# Patient Record
Sex: Female | Born: 1935 | Race: White | Hispanic: No | Smoking: Never smoker
Health system: Southern US, Community
[De-identification: ages and names within clinical notes are randomized; demographics above are authoritative.]

## PROBLEM LIST (undated history)

## (undated) DIAGNOSIS — E785 Hyperlipidemia, unspecified: Secondary | ICD-10-CM

## (undated) DIAGNOSIS — Z780 Asymptomatic menopausal state: Secondary | ICD-10-CM

## (undated) DIAGNOSIS — M1990 Unspecified osteoarthritis, unspecified site: Secondary | ICD-10-CM

## (undated) DIAGNOSIS — I10 Essential (primary) hypertension: Secondary | ICD-10-CM

## (undated) DIAGNOSIS — M8580 Other specified disorders of bone density and structure, unspecified site: Secondary | ICD-10-CM

## (undated) DIAGNOSIS — D649 Anemia, unspecified: Secondary | ICD-10-CM

## (undated) DIAGNOSIS — G5600 Carpal tunnel syndrome, unspecified upper limb: Secondary | ICD-10-CM

## (undated) DIAGNOSIS — H409 Unspecified glaucoma: Secondary | ICD-10-CM

## (undated) DIAGNOSIS — F0390 Unspecified dementia without behavioral disturbance: Secondary | ICD-10-CM

## (undated) DIAGNOSIS — G629 Polyneuropathy, unspecified: Secondary | ICD-10-CM

## (undated) DIAGNOSIS — Z1371 Encounter for nonprocreative screening for genetic disease carrier status: Secondary | ICD-10-CM

## (undated) HISTORY — DX: Asymptomatic menopausal state: Z78.0

## (undated) HISTORY — DX: Hyperlipidemia, unspecified: E78.5

## (undated) HISTORY — DX: Unspecified glaucoma: H40.9

## (undated) HISTORY — DX: Anemia, unspecified: D64.9

## (undated) HISTORY — DX: Polyneuropathy, unspecified: G62.9

## (undated) HISTORY — DX: Unspecified osteoarthritis, unspecified site: M19.90

## (undated) HISTORY — PX: CATARACT EXTRACTION: SUR2

## (undated) HISTORY — PX: MASTECTOMY: SHX3

## (undated) HISTORY — DX: Encounter for nonprocreative screening for genetic disease carrier status: Z13.71

## (undated) HISTORY — DX: Carpal tunnel syndrome, unspecified upper limb: G56.00

## (undated) HISTORY — DX: Other specified disorders of bone density and structure, unspecified site: M85.80

## (undated) HISTORY — PX: TRIGGER FINGER RELEASE: SHX641

## (undated) HISTORY — DX: Essential (primary) hypertension: I10

## (undated) HISTORY — DX: Unspecified dementia, unspecified severity, without behavioral disturbance, psychotic disturbance, mood disturbance, and anxiety: F03.90

---

## 2001-05-14 HISTORY — PX: CARDIAC CATHETERIZATION: SHX172

## 2008-05-14 DIAGNOSIS — Z923 Personal history of irradiation: Secondary | ICD-10-CM

## 2008-05-14 HISTORY — DX: Personal history of irradiation: Z92.3

## 2008-10-13 DIAGNOSIS — C50919 Malignant neoplasm of unspecified site of unspecified female breast: Secondary | ICD-10-CM

## 2008-10-13 HISTORY — DX: Malignant neoplasm of unspecified site of unspecified female breast: C50.919

## 2015-05-15 HISTORY — PX: BLADDER SURGERY: SHX569

## 2020-06-07 HISTORY — PX: VAGINAL PROLAPSE REPAIR: SHX830

## 2020-06-07 HISTORY — PX: OTHER SURGICAL HISTORY: SHX169

## 2020-09-11 DIAGNOSIS — C50919 Malignant neoplasm of unspecified site of unspecified female breast: Secondary | ICD-10-CM | POA: Insufficient documentation

## 2020-09-16 HISTORY — PX: MASTECTOMY: SHX3

## 2021-04-14 ENCOUNTER — Other Ambulatory Visit: Payer: Self-pay

## 2021-04-14 ENCOUNTER — Inpatient Hospital Stay
Admission: EM | Admit: 2021-04-14 | Discharge: 2021-04-19 | DRG: 536 | Disposition: A | Discharge status: Skilled Nursing Facility | Payer: Medicare Other | Attending: Student in an Organized Health Care Education/Training Program | Admitting: Student in an Organized Health Care Education/Training Program

## 2021-04-14 ENCOUNTER — Emergency Department: Payer: Medicare Other

## 2021-04-14 DIAGNOSIS — Y92009 Unspecified place in unspecified non-institutional (private) residence as the place of occurrence of the external cause: Secondary | ICD-10-CM

## 2021-04-14 DIAGNOSIS — Z20822 Contact with and (suspected) exposure to covid-19: Secondary | ICD-10-CM | POA: Diagnosis present

## 2021-04-14 DIAGNOSIS — S72009A Fracture of unspecified part of neck of unspecified femur, initial encounter for closed fracture: Secondary | ICD-10-CM | POA: Diagnosis present

## 2021-04-14 DIAGNOSIS — I440 Atrioventricular block, first degree: Secondary | ICD-10-CM | POA: Diagnosis not present

## 2021-04-14 DIAGNOSIS — E785 Hyperlipidemia, unspecified: Secondary | ICD-10-CM | POA: Diagnosis not present

## 2021-04-14 DIAGNOSIS — S32119A Unspecified Zone I fracture of sacrum, initial encounter for closed fracture: Secondary | ICD-10-CM | POA: Diagnosis present

## 2021-04-14 DIAGNOSIS — S3282XA Multiple fractures of pelvis without disruption of pelvic ring, initial encounter for closed fracture: Principal | ICD-10-CM | POA: Diagnosis present

## 2021-04-14 DIAGNOSIS — I10 Essential (primary) hypertension: Secondary | ICD-10-CM | POA: Diagnosis not present

## 2021-04-14 DIAGNOSIS — R112 Nausea with vomiting, unspecified: Secondary | ICD-10-CM | POA: Diagnosis not present

## 2021-04-14 DIAGNOSIS — E876 Hypokalemia: Secondary | ICD-10-CM | POA: Diagnosis not present

## 2021-04-14 DIAGNOSIS — R001 Bradycardia, unspecified: Secondary | ICD-10-CM | POA: Diagnosis not present

## 2021-04-14 DIAGNOSIS — H409 Unspecified glaucoma: Secondary | ICD-10-CM | POA: Diagnosis not present

## 2021-04-14 DIAGNOSIS — W19XXXA Unspecified fall, initial encounter: Secondary | ICD-10-CM | POA: Diagnosis not present

## 2021-04-14 DIAGNOSIS — I4510 Unspecified right bundle-branch block: Secondary | ICD-10-CM | POA: Diagnosis not present

## 2021-04-14 DIAGNOSIS — F0390 Unspecified dementia without behavioral disturbance: Secondary | ICD-10-CM | POA: Diagnosis not present

## 2021-04-14 DIAGNOSIS — S72001A Fracture of unspecified part of neck of right femur, initial encounter for closed fracture: Secondary | ICD-10-CM

## 2021-04-14 DIAGNOSIS — Z79899 Other long term (current) drug therapy: Secondary | ICD-10-CM

## 2021-04-14 DIAGNOSIS — I119 Hypertensive heart disease without heart failure: Secondary | ICD-10-CM | POA: Diagnosis not present

## 2021-04-14 DIAGNOSIS — E871 Hypo-osmolality and hyponatremia: Secondary | ICD-10-CM | POA: Diagnosis present

## 2021-04-14 DIAGNOSIS — E7849 Other hyperlipidemia: Secondary | ICD-10-CM | POA: Diagnosis not present

## 2021-04-14 LAB — CBC WITH DIFFERENTIAL/PLATELET
Abs Immature Granulocytes: 0.07 10*3/uL (ref 0.00–0.07)
Basophils Absolute: 0.1 10*3/uL (ref 0.0–0.1)
Basophils Relative: 0 %
Eosinophils Absolute: 0 10*3/uL (ref 0.0–0.5)
Eosinophils Relative: 0 %
HCT: 40.5 % (ref 36.0–46.0)
Hemoglobin: 13.7 g/dL (ref 12.0–15.0)
Immature Granulocytes: 1 %
Lymphocytes Relative: 8 %
Lymphs Abs: 1.2 10*3/uL (ref 0.7–4.0)
MCH: 31.2 pg (ref 26.0–34.0)
MCHC: 33.8 g/dL (ref 30.0–36.0)
MCV: 92.3 fL (ref 80.0–100.0)
Monocytes Absolute: 0.8 10*3/uL (ref 0.1–1.0)
Monocytes Relative: 5 %
Neutro Abs: 12.6 10*3/uL — ABNORMAL HIGH (ref 1.7–7.7)
Neutrophils Relative %: 86 %
Platelets: 309 10*3/uL (ref 150–400)
RBC: 4.39 MIL/uL (ref 3.87–5.11)
RDW: 13.1 % (ref 11.5–15.5)
WBC: 14.8 10*3/uL — ABNORMAL HIGH (ref 4.0–10.5)
nRBC: 0 % (ref 0.0–0.2)

## 2021-04-14 LAB — BASIC METABOLIC PANEL
Anion gap: 9 (ref 5–15)
BUN: 21 mg/dL (ref 8–23)
CO2: 29 mmol/L (ref 22–32)
Calcium: 9.4 mg/dL (ref 8.9–10.3)
Chloride: 92 mmol/L — ABNORMAL LOW (ref 98–111)
Creatinine, Ser: 0.7 mg/dL (ref 0.44–1.00)
GFR, Estimated: >60 mL/min (ref >60)
Glucose, Bld: 137 mg/dL — ABNORMAL HIGH (ref 70–99)
Potassium: 3.2 mmol/L — ABNORMAL LOW (ref 3.5–5.1)
Sodium: 130 mmol/L — ABNORMAL LOW (ref 135–145)

## 2021-04-14 LAB — RESP PANEL BY RT-PCR (FLU A&B, COVID) ARPGX2
Influenza A by PCR: NEGATIVE
Influenza B by PCR: NEGATIVE
SARS Coronavirus 2 by RT PCR: NEGATIVE

## 2021-04-14 MED ORDER — MORPHINE SULFATE (PF) 2 MG/ML IV SOLN: 0.5000 mg | INTRAVENOUS | Status: DC | PRN

## 2021-04-14 MED ORDER — SODIUM CHLORIDE 0.9 % IV SOLN
Freq: Once | INTRAVENOUS | Status: AC
  Administered 2021-04-14: 1000 mL via INTRAVENOUS

## 2021-04-14 MED ORDER — LATANOPROST 0.005 % OP SOLN
1.0000 [drp] | Freq: Every day | OPHTHALMIC | Status: DC
  Administered 2021-04-14 – 2021-04-18 (×5): 1 [drp] via OPHTHALMIC
  Filled 2021-04-14: qty 2.5

## 2021-04-14 MED ORDER — METHOCARBAMOL 1000 MG/10ML IJ SOLN
500.0000 mg | Freq: Four times a day (QID) | INTRAVENOUS | Status: DC | PRN
  Filled 2021-04-14: qty 5

## 2021-04-14 MED ORDER — ACETAMINOPHEN 500 MG PO TABS
1000.0000 mg | ORAL_TABLET | Freq: Once | ORAL | Status: AC
  Administered 2021-04-14: 1000 mg via ORAL
  Filled 2021-04-14: qty 2

## 2021-04-14 MED ORDER — ROSUVASTATIN CALCIUM 10 MG PO TABS
20.0000 mg | ORAL_TABLET | Freq: Every day | ORAL | Status: DC
  Administered 2021-04-14 – 2021-04-18 (×5): 20 mg via ORAL
  Filled 2021-04-14 (×4): qty 2
  Filled 2021-04-14: qty 1
  Filled 2021-04-14: qty 2

## 2021-04-14 MED ORDER — ONDANSETRON HCL 4 MG/2ML IJ SOLN
4.0000 mg | Freq: Once | INTRAMUSCULAR | Status: AC
  Administered 2021-04-14: 4 mg via INTRAVENOUS
  Filled 2021-04-14: qty 2

## 2021-04-14 MED ORDER — HYDROCODONE-ACETAMINOPHEN 5-325 MG PO TABS
1.0000 | ORAL_TABLET | Freq: Four times a day (QID) | ORAL | Status: DC | PRN
  Administered 2021-04-14: 1 via ORAL
  Filled 2021-04-14: qty 1

## 2021-04-14 MED ORDER — GLUCOSAMINE CHONDR 1500 COMPLX PO CAPS: 2.0000 | ORAL_CAPSULE | Freq: Every day | ORAL | Status: DC

## 2021-04-14 MED ORDER — OXYCODONE HCL 5 MG PO TABS
2.5000 mg | ORAL_TABLET | Freq: Once | ORAL | Status: AC
  Administered 2021-04-14: 2.5 mg via ORAL
  Filled 2021-04-14: qty 1

## 2021-04-14 MED ORDER — POTASSIUM CHLORIDE CRYS ER 20 MEQ PO TBCR
20.0000 meq | EXTENDED_RELEASE_TABLET | Freq: Once | ORAL | Status: AC
  Administered 2021-04-14: 20 meq via ORAL
  Filled 2021-04-14: qty 1

## 2021-04-14 MED ORDER — ASCORBIC ACID 500 MG PO TABS
1000.0000 mg | ORAL_TABLET | Freq: Every day | ORAL | Status: DC
  Administered 2021-04-15 – 2021-04-19 (×5): 1000 mg via ORAL
  Filled 2021-04-14 (×5): qty 2

## 2021-04-14 MED ORDER — MAGNESIUM OXIDE 400 MG PO TABS
400.0000 mg | ORAL_TABLET | Freq: Every day | ORAL | Status: DC
  Administered 2021-04-15 – 2021-04-19 (×5): 400 mg via ORAL
  Filled 2021-04-14 (×10): qty 1

## 2021-04-14 MED ORDER — POLYETHYLENE GLYCOL 3350 17 G PO PACK
17.0000 g | PACK | Freq: Every day | ORAL | Status: DC | PRN
  Administered 2021-04-18: 17 g via ORAL
  Filled 2021-04-14: qty 1

## 2021-04-14 MED ORDER — METHOCARBAMOL 500 MG PO TABS
500.0000 mg | ORAL_TABLET | Freq: Four times a day (QID) | ORAL | Status: DC | PRN
  Filled 2021-04-14 (×2): qty 1

## 2021-04-14 MED ORDER — TRAZODONE HCL 50 MG PO TABS
25.0000 mg | ORAL_TABLET | Freq: Every evening | ORAL | Status: DC | PRN
  Administered 2021-04-16: 23:00:00 25 mg via ORAL
  Filled 2021-04-14: qty 1

## 2021-04-14 MED ORDER — ENOXAPARIN SODIUM 40 MG/0.4ML IJ SOSY
40.0000 mg | PREFILLED_SYRINGE | INTRAMUSCULAR | Status: DC
  Administered 2021-04-14 – 2021-04-18 (×5): 40 mg via SUBCUTANEOUS
  Filled 2021-04-14 (×5): qty 0.4

## 2021-04-14 MED ORDER — DORZOLAMIDE HCL 2 % OP SOLN
1.0000 [drp] | Freq: Two times a day (BID) | OPHTHALMIC | Status: DC
  Administered 2021-04-15 – 2021-04-19 (×8): 1 [drp] via OPHTHALMIC
  Filled 2021-04-14: qty 10

## 2021-04-14 MED ORDER — CHLORTHALIDONE 25 MG PO TABS
25.0000 mg | ORAL_TABLET | Freq: Every day | ORAL | Status: DC
  Administered 2021-04-15 – 2021-04-17 (×3): 25 mg via ORAL
  Filled 2021-04-14 (×3): qty 1

## 2021-04-14 MED ORDER — VITAMIN D 25 MCG (1000 UNIT) PO TABS
4000.0000 [IU] | ORAL_TABLET | Freq: Every day | ORAL | Status: DC
  Administered 2021-04-15 – 2021-04-19 (×5): 4000 [IU] via ORAL
  Filled 2021-04-14 (×5): qty 4

## 2021-04-14 NOTE — ED Notes (Signed)
Pt NAD in bed on mega mover. A/ox3, baseline per family. Per granddaughter, pt lives with them and was found on the floor of her room. Unwitnessed fall and unknown down time. Pt c/o right hip pain. +pmsc. Denies head/neck pain or blood thinners.

## 2021-04-14 NOTE — H&P (Signed)
[facility] History and Physical  Suzanne Nunez WJX:[id number] DOB: 30-Jun-1931 DOA: 04/14/2021  Referring physician: Dr. Ellender Hose PCP: No primary care provider on file.   Chief Complaint: fall  HPI: Suzanne Nunez is a 87 y.o. female with history of dementia, hypertension, who presents after a fall.  Patient lives with her granddaughter who is at bedside along with patient's daughter.  Patient appeared somewhat confused whenever I tried to obtain history from her, so majority of history obtained from combination of patient and her granddaughter.  They report that she has fallen once or twice before according to her uncle who used to live with the patient.  They have been encouraging her to use walls and doorways to steady herself as well as a cane and a walker, but she does not like it.  Earlier today she had an unwitnessed fall onto her right side, her granddaughter found her and called EMS.  Patient currently denies any numbness or tingling in her lower extremities, does have some pain in her hip.  In the ED vital signs were unremarkable.  Basic labs showed very mild leukocytosis, mild hyponatremia at 130, potassium 3.2, glucose 137, remainder of labs unremarkable.  CT head showed no acute intracranial abnormality.  Plain films of the hip showed acute fracture of the right pubic bone.  Noncon CT pelvis was subsequently obtained which showed comminuted fracture of the right superior and inferior pubic rami without involvement of the acetabulum, as well as a minimally displaced fracture of the right sacral ala.  EKG showed no acute ischemic changes.  ED provider spoke with on-call orthopedist who reviewed case and and stated that she was nonoperative and could be weightbearing as tolerated.  She is admitted for further management.  Review of Systems:  Pertinent positives and negative per HPI, all others reviewed and negative  History reviewed. No pertinent past medical history. History  reviewed. No pertinent surgical history. Social History:  has no history on file for tobacco use, alcohol use, and drug use.  No Known Allergies  History reviewed. No pertinent family history.   Prior to Admission medications   Not on File   Physical Exam: Vitals:   04/14/21 1011 04/14/21 1015 04/14/21 1615  BP: 137/80  (!) 144/85  Pulse: 73  69  Resp: 16  15  Temp: 98.2 F (36.8 C)    SpO2: 94%  97%  Weight:  59 kg   Height:  5\' 3"  (1.6 m)     Wt Readings from Last 3 Encounters:  04/14/21 59 kg     General:  Appears calm and comfortable Eyes: PERRL, normal lids, irises & conjunctiva ENT: grossly normal hearing, lips & tongue Neck: no masses Cardiovascular: RRR, no m/r/g. No LE edema.  2+ DP pulses bilaterally Telemetry: SR, no arrhythmias  Respiratory: CTA bilaterally, no w/r/r. Normal respiratory effort. Abdomen: soft, ntnd Skin: no rash or induration seen on limited exam Musculoskeletal: grossly normal tone BUE/BLE Psychiatric: grossly normal mood and affect, speech fluent and appropriate Neurologic: grossly non-focal.  Sensation intact to light touch in lower extremities bilaterally, spontaneous movements of bilateral lower extremities observed.          Labs on Admission:  Basic Metabolic Panel: Recent Labs  Lab 04/14/21 1440  NA 130*  K 3.2*  CL 92*  CO2 29  GLUCOSE 137*  BUN 21  CREATININE 0.70  CALCIUM 9.4   Liver Function Tests: No results for input(s): AST, ALT, ALKPHOS, BILITOT, PROT, ALBUMIN in the last 168  hours. No results for input(s): LIPASE, AMYLASE in the last 168 hours. No results for input(s): AMMONIA in the last 168 hours. CBC: Recent Labs  Lab 04/14/21 1440  WBC 14.8*  NEUTROABS 12.6*  HGB 13.7  HCT 40.5  MCV 92.3  PLT 309   Cardiac Enzymes: No results for input(s): CKTOTAL, CKMB, CKMBINDEX, TROPONINI in the last 168 hours.  BNP (last 3 results) No results for input(s): BNP in the last 8760 hours.  ProBNP (last 3  results) No results for input(s): PROBNP in the last 8760 hours.  CBG: No results for input(s): GLUCAP in the last 168 hours.  Radiological Exams on Admission: CT Head Wo Contrast  Result Date: 04/14/2021 CLINICAL DATA:  Trauma EXAM: CT HEAD WITHOUT CONTRAST TECHNIQUE: Contiguous axial images were obtained from the base of the skull through the vertex without intravenous contrast. COMPARISON:  None. FINDINGS: Brain: Chronic white matter ischemic change. No evidence of acute infarction, hemorrhage, hydrocephalus, extra-axial collection or mass lesion/mass effect. Vascular: No hyperdense vessel or unexpected calcification. Skull: Normal. Negative for fracture or focal lesion. Sinuses/Orbits: Partial opacification of the right sphenoid sinus. No acute finding. Other: None. IMPRESSION: No acute intracranial abnormality. Electronically Signed   By: Yetta Glassman M.D.   On: 04/14/2021 11:39   CT PELVIS WO CONTRAST  Result Date: 04/14/2021 CLINICAL DATA:  Fall, evaluate right pubic fractures EXAM: CT PELVIS WITHOUT CONTRAST TECHNIQUE: Multidetector CT imaging of the pelvis was performed following the standard protocol without intravenous contrast. COMPARISON:  Same day radiographs of the right hip FINDINGS: Urinary Tract:  No abnormality visualized. Bowel:  Sigmoid diverticulosis. Vascular/Lymphatic: No pathologically enlarged lymph nodes. Aortic atherosclerosis. Reproductive:  No mass or other significant abnormality Other:  None. Musculoskeletal: No suspicious bone lesions identified. Osteopenia. Comminuted fractures of the right superior and inferior pubic rami about the pubic symphysis (series 2, image 81) with additional minimally displaced fracture of the mid right inferior pubic ramus (series 2, image 98). Additional minimally displaced fractures of the right sacral ala (series 2, image 37). IMPRESSION: 1. Comminuted fractures of the right superior and inferior pubic rami about the pubic symphysis  with additional minimally displaced fracture of the mid right inferior pubic ramus, as seen on prior radiographs. No involvement of the acetabulum. 2. Additional minimally displaced fractures of the right sacral ala. Aortic Atherosclerosis (ICD10-I70.0). Electronically Signed   By: Delanna Ahmadi M.D.   On: 04/14/2021 13:20   DG Hip Unilat  With Pelvis 2-3 Views Right  Result Date: 04/14/2021 CLINICAL DATA:  Fall, hip pain EXAM: DG HIP (WITH OR WITHOUT PELVIS) 2-3V RIGHT COMPARISON:  None. FINDINGS: Acute comminuted fractures of the right parasymphyseal pubic bone and superior and inferior pubic rami, with minimal displacement of fracture fragments. No acute fracture of the proximal right femur. No hip dislocation. IMPRESSION: Acute fractures of the right pubic bone. Electronically Signed   By: Ofilia Neas M.D.   On: 04/14/2021 12:01    EKG: Independently reviewed.  Sinus, signs of LVH and lateral precordial leads, no ischemic changes, frequent PVCs.  Assessment/Plan Principal Problem:   Hip fracture (HCC)   Suzanne Nunez is a 87 y.o. female with history of dementia, hypertension, who presents after a fall and is found to have a hip fracture.  #Hip fracture Per discussion with orthopedics patient is not an operative candidate, needs rehab and can be weightbearing as tolerated. - PT and OT consults - Weightbearing as tolerated - Routine hip fracture care  #Chronic medical problems Hypertension-continue chlorthalidone  Glaucoma-continue dorzolamide, latanoprost  Hyperlipidemia-continue rosuvastatin  Code Status: Full code, confirmed DVT Prophylaxis: Lovenox Family Communication: Daughter and granddaughter updated at bedside Disposition Plan: Inpatient, MedSurg  Time spent: 80 min  Clarnce Flock MD/[facility]  Note:  This document was prepared using Systems analyst and may include unintentional dictation errors.

## 2021-04-14 NOTE — ED Provider Notes (Signed)
Patient seen by Dr. Tamala Julian. Admit to medicine for pelvic fx s/p mechanical fall. Family updated and in agreement. Will trial low dose oxycodone and tylenol for pain.    Duffy Bruce, MD 04/14/21 Dorthula Perfect

## 2021-04-14 NOTE — ED Provider Notes (Signed)
[facility] Emergency Department Provider Note ____________________________________________   Event Date/Time   First MD Initiated Contact with Patient 04/14/21 1016     (approximate)  I have reviewed the triage vital signs and the nursing notes.  HISTORY  Chief Complaint Fall and Hip Pain   HPI Suzanne Nunez is a 87 y.o. femalewho presents to the ED for evaluation of fall and right-sided hip pain.  Chart review indicates no relevant history within our system.  Daughter brings patient to the ED for the ration of an unwitnessed fall. Patient lives at home with family, typically ambulatory with a cane, sometimes using the walker.  Daughter found her this morning facedown on the rug in her bedroom, awake without evidence of seizure activity.  Patient is pleasantly disoriented and cannot give significant history about what happened.  Daughter reports that she was fine yesterday and seems to be at her behavioral baseline now.  No recent illnesses or other concerns beyond this fall today.   History limited by disorientation  History reviewed. No pertinent past medical history.  There are no problems to display for this patient.    Prior to Admission medications   Not on File    Allergies Patient has no known allergies.  No family history on file.  Social History    Review of Systems  Unable to accurately assess due to patient's disorientation. ____________________________________________   PHYSICAL EXAM:  VITAL SIGNS: Vitals:   04/14/21 1011  BP: 137/80  Pulse: 73  Resp: 16  Temp: 98.2 F (36.8 C)  SpO2: 94%    Constitutional: Alert and oriented. Well appearing and in no acute distress. Eyes: Conjunctivae are normal. PERRL. EOMI. Head: Atraumatic. Nose: No congestion/rhinnorhea. Mouth/Throat: Mucous membranes are moist.  Oropharynx non-erythematous. Neck: No stridor. No cervical spine tenderness to palpation. Cardiovascular:  Normal rate, regular rhythm. Grossly normal heart sounds.  Good peripheral circulation. Respiratory: Normal respiratory effort.  No retractions. Lungs CTAB. Gastrointestinal: Soft , nondistended, nontender to palpation. No CVA tenderness. Musculoskeletal: Minimal tenderness with logroll of the right lower extremity.  No shortening or rotation.  Is able to flex her right knee and hip, although appears to cause some discomfort. Bilateral legs are distally neurovascularly intact. No othe signs of trauma or deformity beyond the mild tenderness to her right hip with ranging. Neurologic:  Normal speech and language. No gross focal neurologic deficits are appreciated. No gait instability noted. Skin:  Skin is warm, dry and intact. No rash noted. Psychiatric: Mood and affect are normal. Speech and behavior are normal. ____________________________________________   LABS (all labs ordered are listed, but only abnormal results are displayed)  Labs Reviewed  RESP PANEL BY RT-PCR (FLU A&B, COVID) [id number]  URINALYSIS, COMPLETE (UACMP) WITH MICROSCOPIC  CBC WITH DIFFERENTIAL/PLATELET  BASIC METABOLIC PANEL   ____________________________________________  12 Lead EKG   ____________________________________________  RADIOLOGY  ED MD interpretation: CT head reviewed by me without evidence of acute cranial pathology. Plain film of the right hip and pelvis reviewed by me with evidence of superior and inferior pubic rami fractures.  Official radiology report(s): CT Head Wo Contrast  Result Date: 04/14/2021 CLINICAL DATA:  Trauma EXAM: CT HEAD WITHOUT CONTRAST TECHNIQUE: Contiguous axial images were obtained from the base of the skull through the vertex without intravenous contrast. COMPARISON:  None. FINDINGS: Brain: Chronic white matter ischemic change. No evidence of acute infarction, hemorrhage, hydrocephalus, extra-axial collection or mass lesion/mass effect. Vascular: No hyperdense vessel or  unexpected calcification. Skull: Normal. Negative  for fracture or focal lesion. Sinuses/Orbits: Partial opacification of the right sphenoid sinus. No acute finding. Other: None. IMPRESSION: No acute intracranial abnormality. Electronically Signed   By: Yetta Glassman M.D.   On: 04/14/2021 11:39   CT PELVIS WO CONTRAST  Result Date: 04/14/2021 CLINICAL DATA:  Fall, evaluate right pubic fractures EXAM: CT PELVIS WITHOUT CONTRAST TECHNIQUE: Multidetector CT imaging of the pelvis was performed following the standard protocol without intravenous contrast. COMPARISON:  Same day radiographs of the right hip FINDINGS: Urinary Tract:  No abnormality visualized. Bowel:  Sigmoid diverticulosis. Vascular/Lymphatic: No pathologically enlarged lymph nodes. Aortic atherosclerosis. Reproductive:  No mass or other significant abnormality Other:  None. Musculoskeletal: No suspicious bone lesions identified. Osteopenia. Comminuted fractures of the right superior and inferior pubic rami about the pubic symphysis (series 2, image 81) with additional minimally displaced fracture of the mid right inferior pubic ramus (series 2, image 98). Additional minimally displaced fractures of the right sacral ala (series 2, image 37). IMPRESSION: 1. Comminuted fractures of the right superior and inferior pubic rami about the pubic symphysis with additional minimally displaced fracture of the mid right inferior pubic ramus, as seen on prior radiographs. No involvement of the acetabulum. 2. Additional minimally displaced fractures of the right sacral ala. Aortic Atherosclerosis (ICD10-I70.0). Electronically Signed   By: Delanna Ahmadi M.D.   On: 04/14/2021 13:20   DG Hip Unilat  With Pelvis 2-3 Views Right  Result Date: 04/14/2021 CLINICAL DATA:  Fall, hip pain EXAM: DG HIP (WITH OR WITHOUT PELVIS) 2-3V RIGHT COMPARISON:  None. FINDINGS: Acute comminuted fractures of the right parasymphyseal pubic bone and superior and inferior pubic rami,  with minimal displacement of fracture fragments. No acute fracture of the proximal right femur. No hip dislocation. IMPRESSION: Acute fractures of the right pubic bone. Electronically Signed   By: Ofilia Neas M.D.   On: 04/14/2021 12:01    ____________________________________________   PROCEDURES and INTERVENTIONS  Procedure(s) performed (including Critical Care):  Procedures  Medications  acetaminophen (TYLENOL) tablet 1,000 mg (has no administration in time range)    ____________________________________________   MDM / ED COURSE   Pleasantly demented 87 year old female presents to the ED after unwitnessed fall, with evidence of pelvic bone fractures, requiring medical observation admission to facilitate rehab placement.  Nonoperative fractures noted on x-ray and CT imaging.  No neurologic or vascular deficits.  Attempted to get the patient up and ambulate to hopefully discharge home to the custody of family, who is eager to keep her around and care for her, but due to inability to even stand and ambulate, and uncomfortable with outpatient disposition at this time as it does not appear to be safe for the patient.  Patient still unfortunately in our triage area, we will have to find her a room in the ED prior to planned admission  Clinical Course as of 04/14/21 1456  Fri Apr 14, 2021  1159 Updated daughter [DS]  1214 Discussed CT of the pelvis with daughter.  She is agreeable. [DS]  61 Discussed CT with daughter. Discussed ambulation trial and need for UA. I gave pt some water [DS]  1410 I briefly discussed on the phone with Dr. Mack Guise about CT results.  He reports nonoperative.  Pain control.  Possible SNF placement for PT. [DS]  7482 Patient unable to effectively ambulate.  Even with two-person assistance, she cannot stand and take any steps with a walker.  I discussed this with the daughter and my concern for unsafe discharge plan,  despite family's eagerness to keep her  home.  We discussed medical observation admission to facilitate rehab placement temporarily and they are agreeable. [DS]    Clinical Course User Index [DS] Vladimir Crofts, MD    ____________________________________________   FINAL CLINICAL IMPRESSION(S) / ED DIAGNOSES  Final diagnoses:  Fall, initial encounter  Multiple closed fractures of pelvis without disruption of pelvic ring, initial encounter [facility])     ED Discharge Orders     None        Michon Kaczmarek   Note:  This document was prepared using Dragon voice recognition software and may include unintentional dictation errors.    Vladimir Crofts, MD 04/14/21 1500

## 2021-04-14 NOTE — ED Provider Notes (Signed)
Emergency Medicine Provider Triage Evaluation Note  Suzanne Nunez , a 87 y.o. female  was evaluated in triage.  Pt complains of fall.  Pleasantly demented patient lives at home with her daughter.  Daughter brings patient to the ED for evaluation of an unwitnessed fall.  She was found to have in her bedroom this morning, face down.  She is at her behavioral baseline now.  Disoriented.  Concerns for her right hip..  Review of Systems  Positive: Fall, right hip pain, dementia baseline Negative: No recent illnesses, no known syncopal episodes or seizure activity.  Physical Exam  BP 137/80 (BP Location: Left Arm)   Pulse 73   Temp 98.2 F (36.8 C)   Resp 16   Ht 5\' 3"  (1.6 m)   Wt 59 kg   SpO2 94%   BMI 23.03 kg/m  Gen:   Awake, no distress   Resp:  Normal effort  MSK:   Palpation of all 4 extremities without deformity.  Logrolling of the right lower extremity with no/minimal pain.  She can actually flex her hip and range her knee partially, but has pain with more extreme hip flexion. Other:    Medical Decision Making  Medically screening exam initiated at 10:17 AM.  Appropriate orders placed.  Azul Zuver was informed that the remainder of the evaluation will be completed by another provider, this initial triage assessment does not replace that evaluation, and the importance of remaining in the ED until their evaluation is complete.  Imaging and UA ordered   Vladimir Crofts, MD 04/14/21 1018

## 2021-04-14 NOTE — ED Triage Notes (Signed)
Pt comes into the Ed via EMS from home, called out for a fall, c/o right hip pain Hx of dementia, pt is not aware of falling, per family this is her baseline Daughter is with the pt in the [place] 173/78 70's HR\ 97%RA [id number]

## 2021-04-15 ENCOUNTER — Encounter: Payer: Self-pay | Admitting: Family Medicine

## 2021-04-15 DIAGNOSIS — E7849 Other hyperlipidemia: Secondary | ICD-10-CM

## 2021-04-15 DIAGNOSIS — I10 Essential (primary) hypertension: Secondary | ICD-10-CM | POA: Diagnosis not present

## 2021-04-15 DIAGNOSIS — S72001A Fracture of unspecified part of neck of right femur, initial encounter for closed fracture: Secondary | ICD-10-CM | POA: Diagnosis not present

## 2021-04-15 DIAGNOSIS — S3282XA Multiple fractures of pelvis without disruption of pelvic ring, initial encounter for closed fracture: Secondary | ICD-10-CM | POA: Diagnosis not present

## 2021-04-15 DIAGNOSIS — S72009A Fracture of unspecified part of neck of unspecified femur, initial encounter for closed fracture: Secondary | ICD-10-CM | POA: Diagnosis not present

## 2021-04-15 LAB — CBC
HCT: 37.2 % (ref 36.0–46.0)
Hemoglobin: 12.5 g/dL (ref 12.0–15.0)
MCH: 31.3 pg (ref 26.0–34.0)
MCHC: 33.6 g/dL (ref 30.0–36.0)
MCV: 93.2 fL (ref 80.0–100.0)
Platelets: 278 10*3/uL (ref 150–400)
RBC: 3.99 MIL/uL (ref 3.87–5.11)
RDW: 13.2 % (ref 11.5–15.5)
WBC: 8.7 10*3/uL (ref 4.0–10.5)
nRBC: 0 % (ref 0.0–0.2)

## 2021-04-15 LAB — BASIC METABOLIC PANEL
Anion gap: 7 (ref 5–15)
BUN: 26 mg/dL — ABNORMAL HIGH (ref 8–23)
CO2: 31 mmol/L (ref 22–32)
Calcium: 8.8 mg/dL — ABNORMAL LOW (ref 8.9–10.3)
Chloride: 90 mmol/L — ABNORMAL LOW (ref 98–111)
Creatinine, Ser: 1 mg/dL (ref 0.44–1.00)
GFR, Estimated: 55 mL/min — ABNORMAL LOW (ref >60)
Glucose, Bld: 117 mg/dL — ABNORMAL HIGH (ref 70–99)
Potassium: 3.6 mmol/L (ref 3.5–5.1)
Sodium: 128 mmol/L — ABNORMAL LOW (ref 135–145)

## 2021-04-15 LAB — URINALYSIS, COMPLETE (UACMP) WITH MICROSCOPIC
Bilirubin Urine: NEGATIVE
Glucose, UA: NEGATIVE mg/dL
Ketones, ur: 5 mg/dL — AB
Leukocytes,Ua: NEGATIVE
Nitrite: NEGATIVE
Protein, ur: 100 mg/dL — AB
Specific Gravity, Urine: 1.02 (ref 1.005–1.030)
pH: 5 (ref 5.0–8.0)

## 2021-04-15 MED ORDER — TIMOLOL MALEATE 0.5 % OP SOLN
1.0000 [drp] | Freq: Two times a day (BID) | OPHTHALMIC | Status: DC
  Administered 2021-04-15 – 2021-04-17 (×4): 1 [drp] via OPHTHALMIC
  Filled 2021-04-15: qty 5

## 2021-04-15 MED ORDER — ACETAMINOPHEN 500 MG PO TABS
1000.0000 mg | ORAL_TABLET | Freq: Four times a day (QID) | ORAL | Status: DC | PRN
  Administered 2021-04-15 – 2021-04-18 (×8): 1000 mg via ORAL
  Filled 2021-04-15 (×8): qty 2

## 2021-04-15 MED ORDER — OXYCODONE HCL 5 MG PO TABS
2.5000 mg | ORAL_TABLET | ORAL | Status: DC | PRN
  Administered 2021-04-16 – 2021-04-18 (×3): 2.5 mg via ORAL
  Filled 2021-04-15 (×3): qty 1

## 2021-04-15 MED ORDER — LETROZOLE 2.5 MG PO TABS
2.5000 mg | ORAL_TABLET | Freq: Every day | ORAL | Status: DC
  Administered 2021-04-15 – 2021-04-19 (×5): 2.5 mg via ORAL
  Filled 2021-04-15 (×5): qty 1

## 2021-04-15 NOTE — Evaluation (Signed)
Occupational Therapy Evaluation Patient Details Name: Suzanne Nunez MRN: [id number] DOB: November 24, 1931 Today's Date: 04/15/2021   History of Present Illness Pt admitted to [facility] on 04/14/21 for mechanical fall that resulted in R superior and inferior pubic rami fracture. Pt not an operative candidate; to proceed with WBAT. Significant PMH includes: dementia and HTN.   Clinical Impression   Chart reviewed, pt greeted in bed with granddaughter present at bedside. Pt was seen for OT evaluation this date s/p fall, pelvic fracture. Pt non operative, WBAT. Prior to hospital admission, pt lives with granddaughter, granddaughters husband, great grandson (60 years old).Granddaughter reports increased incidents of waking in the night to use the bathroom as well as wandering. Currently pt demonstrates impairments as described below (See OT problem list) which functionally limit her ability to perform ADL/self-care tasks. Pt required required mod assist +2 for bed mobility, mod-max assist for transfers, and max assist to take 2 steps at bedside with RW. Pain limited activity throughout. Pt would benefit from skilled OT services to address noted impairments and functional limitations (see below for any additional details) in order to maximize safety and independence while minimizing falls risk and caregiver burden. Upon hospital discharge, recommend discharge to STR to maximize pt safety and return to PLOF.       Recommendations for follow up therapy are one component of a multi-disciplinary discharge planning process, led by the attending physician.  Recommendations may be updated based on patient status, additional functional criteria and insurance authorization.   Follow Up Recommendations  Skilled nursing-short term rehab (<3 hours/day)    Assistance Recommended at Discharge Frequent or constant Supervision/Assistance  Functional Status Assessment  Patient has had a recent decline in their functional status  and demonstrates the ability to make significant improvements in function in a reasonable and predictable amount of time.  Equipment Recommendations   (per next venue of care)    Recommendations for Other Services       Precautions / Restrictions Precautions Precautions: Fall Restrictions Weight Bearing Restrictions: Yes RLE Weight Bearing: Weight bearing as tolerated      Mobility Bed Mobility Overal bed mobility: Needs Assistance Bed Mobility: Supine to Sit;Sit to Supine     Supine to sit: Mod assist;+2 for safety/equipment;HOB elevated Sit to supine: Mod assist;+2 for safety/equipment   General bed mobility comments: step by step cueing    Transfers Overall transfer level: Needs assistance Equipment used: Rolling walker (2 wheels) Transfers: Sit to/from Stand Sit to Stand: Mod assist;Max assist;+2 physical assistance;From elevated surface           General transfer comment: step by step vcs      Balance Overall balance assessment: Needs assistance Sitting-balance support: Bilateral upper extremity supported;Feet unsupported Sitting balance-Leahy Scale: Fair Sitting balance - Comments: no LOB at EOB   Standing balance support: Bilateral upper extremity supported;During functional activity Standing balance-Leahy Scale: Poor Standing balance comment: in RW, unable to tolerate RLE WB                           ADL either performed or assessed with clinical judgement   ADL Overall ADL's : Needs assistance/impaired Eating/Feeding: Supervision/ safety   Grooming: Wash/dry hands;Wash/dry face;Supervision/safety;Sitting;Cueing for sequencing           Upper Body Dressing : Maximal assistance;Sitting Upper Body Dressing Details (indicate cue type and reason): step by step vcs Lower Body Dressing: Maximal assistance Lower Body Dressing Details (indicate cue type and reason):  baseline per caregiver report             Functional mobility during  ADLs: +2 for physical assistance;Moderate assistance;Maximal assistance General ADL Comments: STS MOD-MAX A     Vision Patient Visual Report: No change from baseline       Perception     Praxis      Pertinent Vitals/Pain Pain Assessment: Faces Faces Pain Scale: Hurts even more Pain Location: RLE Pain Intervention(s): Limited activity within patient's tolerance;Monitored during session;Repositioned;Patient requesting pain meds-RN notified     Hand Dominance     Extremity/Trunk Assessment Upper Extremity Assessment Upper Extremity Assessment: Generalized weakness;RUE deficits/detail;LUE deficits/detail RUE Deficits / Details: per caregiver report AROM deficits in B 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, pain   Lower Extremity Assessment Lower Extremity Assessment: Generalized weakness       Communication Communication Communication: No difficulties   Cognition Arousal/Alertness: Awake/alert Behavior During Therapy: WFL for tasks assessed/performed Overall Cognitive Status: History of cognitive impairments - at baseline                                 General Comments: hx of dementia, oriented to self, place, situation, unable to state time (grandaugther reports is baseline)     General Comments  abrasion on  Lknee from fall    Exercises Other Exercises Other Exercises: patient and grandaugther educated re: role of OT, dc recommendations, benefits of progressive mobility, WBAT precautions Other Exercises: Pt and granddaughter educated re: PT role/POC, DC recommendations, pain management, breathing techniques, WB precautions, benefits of OOB mobility, and safety with mobility.   Shoulder Instructions      Home Living Family/patient expects to be discharged to:: Private residence Living Arrangements: Other (Comment) (granddaugther) Available Help at Discharge: Family;Available PRN/intermittently Type of Home: House Home Access: Stairs to  enter Entrance Stairs-Number of Steps: 1                   Home Equipment: Advice worker (2 wheels);Cane - single point   Additional Comments: lift chair, reports building a ramp, installing grab bars for bathroom      Prior Functioning/Environment Prior Level of Function : Needs assist  Cognitive Assist : Mobility (cognitive);ADLs (cognitive) Mobility (Cognitive): Intermittent cues ADLs (Cognitive): Intermittent cues Physical Assist : Mobility (physical);ADLs (physical) Mobility (physical): Gait ADLs (physical): Grooming;Bathing;Dressing;Toileting;IADLs Mobility Comments: reports pt has become unsteady- furniture/wall walking. ADLs Comments: assist required for ADl/IADL. Pt with recent mastectomy, limiting shoulder flexon AROM, tenderness to B underarms        OT Problem List: Decreased strength;Decreased activity tolerance;Decreased cognition;Decreased knowledge of use of DME or AE;Impaired UE functional use;Decreased range of motion;Impaired balance (sitting and/or standing);Decreased safety awareness;Decreased knowledge of precautions      OT Treatment/Interventions: Self-care/ADL training;Modalities;Cognitive remediation/compensation;Balance training;Therapeutic exercise;DME and/or AE instruction;Therapeutic activities;Patient/family education    OT Goals(Current goals can be found in the care plan section) Acute Rehab OT Goals Patient Stated Goal: to feel better OT Goal Formulation: With patient Time For Goal Achievement: 04/29/21 Potential to Achieve Goals: Good ADL Goals Pt Will Perform Grooming: with min assist Pt Will Perform Lower Body Bathing: with min assist;sitting/lateral leans Pt Will Perform Lower Body Dressing: with mod assist Pt Will Transfer to Toilet: with min assist;bedside commode Pt Will Perform Toileting - Clothing Manipulation and hygiene: with min assist;sit to/from stand  OT Frequency: Min 3X/week   Barriers to D/C:  Co-evaluation PT/OT/SLP Co-Evaluation/Treatment: Yes Reason for Co-Treatment: Complexity of the patient's impairments (multi-system involvement);For patient/therapist safety PT goals addressed during session: Mobility/safety with mobility OT goals addressed during session: ADL's and self-care;Proper use of Adaptive equipment and DME      AM-PAC OT "6 Clicks" Daily Activity     Outcome Measure Help from another person eating meals?: None Help from another person taking care of personal grooming?: A Little Help from another person toileting, which includes using toliet, bedpan, or urinal?: A Lot Help from another person bathing (including washing, rinsing, drying)?: A Lot Help from another person to put on and taking off regular upper body clothing?: A Lot Help from another person to put on and taking off regular lower body clothing?: A Lot 6 Click Score: 15   End of Session Equipment Utilized During Treatment: Gait belt;Rolling walker (2 wheels) Nurse Communication: Mobility status;Patient requests pain meds  Activity Tolerance: Patient limited by pain Patient left: in bed;with call bell/phone within reach;with family/visitor present  OT Visit Diagnosis: Unsteadiness on feet (R26.81);Other abnormalities of gait and mobility (R26.89);Repeated falls (R29.6);Muscle weakness (generalized) (M62.81)                Time: 1324-4010 OT Time Calculation (min): 33 min Charges:  OT General Charges $OT Visit: 1 Visit OT Evaluation $OT Eval Moderate Complexity: 1 Mod OT Treatments $Self Care/Home Management : 8-22 mins Shanon Payor, OTD OTR/L  04/15/21, 1:02 PM

## 2021-04-15 NOTE — Progress Notes (Signed)
Physical Therapy Treatment Patient Details Name: Charlisha Market MRN: [id number] DOB: May 10, 1932 Today's Date: 04/15/2021   History of Present Illness Pt admitted to [facility] on 04/14/21 for mechanical fall that resulted in R superior and inferior pubic rami fracture. Pt not an operative candidate; to proceed with WBAT. Significant PMH includes: dementia and HTN.    PT Comments    Pt tolerated treatment fair today with overall participation limited secondary to increased pain levels. Pt required max assist for bed mobility and max assist for stand-pivot transfers from EOB<>BSC for toileting. Unable to participate in gait this afternoon due to significant RLE pain in WB. Pt required increased multimodal cues for safety and sequencing with stand pivot transfer due to resistance of UE on BSC/bed rail from pain. Pt able to perform BLE therex in supine, demonstrating R hip flexion and bil (R>L) quad weakness. Granddaughter mentioned "pediatric dose of pain medicine" in ED that helped alleviate pt pain, but did not cause bradycardia. Pt may benefit from pain medication/dosage like this, to help improve overall tolerance for PT and progress towards goals; RN and MD aware. Pt will continue to benefit from skilled acute PT services to address deficits for return to baseline function. Will continue to recommend SNF at DC.   Recommendations for follow up therapy are one component of a multi-disciplinary discharge planning process, led by the attending physician.  Recommendations may be updated based on patient status, additional functional criteria and insurance authorization.  Follow Up Recommendations  Skilled nursing-short term rehab (<3 hours/day)     Assistance Recommended at Discharge Intermittent Supervision/Assistance  Equipment Recommendations  BSC/3in1 (defer to post acute; granddaughter would like BSC for home)       Precautions / Restrictions Precautions Precautions: Fall Restrictions Weight  Bearing Restrictions: Yes RLE Weight Bearing: Weight bearing as tolerated     Mobility  Bed Mobility Overal bed mobility: Needs Assistance Bed Mobility: Supine to Sit;Sit to Supine;Rolling Rolling: Max assist   Supine to sit: Max assist Sit to supine: Max assist   General bed mobility comments: for trunk/BLE facilitation; multimodal cues for safety, sequencing, and hand placement    Transfers Overall transfer level: Needs assistance Equipment used: Rolling walker (2 wheels) Transfers: Bed to chair/wheelchair/BSC Sit to Stand: Mod assist Stand pivot transfers: Max assist         General transfer comment: for power to stand from EOB with RW; multimodal cues for safety, sequencing, and hand placement. Max assist for stand pivot transfer from EOB<>BSC for toileting; resistive to transfer due to pain, requiring increased cueing for safety.    Ambulation/Gait Ambulation/Gait assistance: Max assist Gait Distance (Feet): 1 Feet (2 lateral steps with R at bedside; unable to take a step with LLE) Assistive device: Rolling walker (2 wheels)         General Gait Details: attempted but unable due to pt inability to WB on RLE for LLE facilitation     Balance Overall balance assessment: Needs assistance Sitting-balance support: Bilateral upper extremity supported;Feet unsupported Sitting balance-Leahy Scale: Fair Sitting balance - Comments: no LOB at EOB   Standing balance support: Bilateral upper extremity supported;During functional activity Standing balance-Leahy Scale: Poor Standing balance comment: in RW, unable to tolerate RLE WB                            Cognition Arousal/Alertness: Awake/alert Behavior During Therapy: WFL for tasks assessed/performed Overall Cognitive Status: History of cognitive impairments - at baseline  General Comments: able to follow 100% of simple 2-step commands, requiring increased time  for processing of task. Increased cueing for safety with WB due to pain levels        Exercises Total Joint Exercises Ankle Circles/Pumps: AROM;Strengthening;10 reps;Both Quad Sets: AROM;Strengthening;Both;10 reps Gluteal Sets: AROM;Strengthening;Both;10 reps Towel Squeeze: AROM;Strengthening;Both;10 reps Short Arc Quad: AROM;Strengthening;Both;10 reps Heel Slides: AROM;Strengthening;Both;10 reps Hip ABduction/ADduction: AROM;Strengthening;Both;10 reps Straight Leg Raises: AROM;Left;AAROM;Right;10 reps Other Exercises Other Exercises: Participated in bed mobility, transfers, toileting on BSC, and HEP. Increased assist and cues for safety due to pain with WB on RLE. Other Exercises: Pt and granddaughter MIL educated re: PT role/POC, DC recommendations, pain management, breathing techniques, WB precautions, benefits of OOB mobility, and safety with mobility.    General Comments General comments (skin integrity, edema, etc.): voided and had BM on BSC; changed all sheets and pt socks      Pertinent Vitals/Pain Pain Assessment: Faces Pain Score: 9  Faces Pain Scale: Hurts even more Pain Location: RLE during WB Pain Intervention(s): Limited activity within patient's tolerance;Monitored during session;Repositioned;Premedicated before session    Home Living Family/patient expects to be discharged to:: Private residence Living Arrangements: Other (Comment) (granddaugther) Available Help at Discharge: Family;Available PRN/intermittently Type of Home: House Home Access: Stairs to enter   Entrance Stairs-Number of Steps: 1     Home Equipment: Advice worker (2 wheels);Cane - single point Additional Comments: lift chair, reports building a ramp, installing grab bars for bathroom        PT Goals (current goals can now be found in the care plan section) Acute Rehab PT Goals Patient Stated Goal: "to walk" PT Goal Formulation: With patient/family Time For Goal Achievement:  04/29/21 Potential to Achieve Goals: Good Progress towards PT goals: Progressing toward goals    Frequency    7X/week      PT Plan Current plan remains appropriate       AM-PAC PT "6 Clicks" Mobility   Outcome Measure  Help needed turning from your back to your side while in a flat bed without using bedrails?: A Lot Help needed moving from lying on your back to sitting on the side of a flat bed without using bedrails?: A Lot Help needed moving to and from a bed to a chair (including a wheelchair)?: A Lot Help needed standing up from a chair using your arms (e.g., wheelchair or bedside chair)?: A Lot Help needed to walk in hospital room?: A Lot Help needed climbing 3-5 steps with a railing? : Total 6 Click Score: 11    End of Session Equipment Utilized During Treatment: Gait belt Activity Tolerance: Patient limited by pain Patient left: in bed;with call bell/phone within reach;with bed alarm set;with family/visitor present (pillow under BLE) Nurse Communication: Mobility status;Patient requests pain meds PT Visit Diagnosis: Unsteadiness on feet (R26.81);Repeated falls (R29.6);Muscle weakness (generalized) (M62.81);Difficulty in walking, not elsewhere classified (R26.2);Pain Pain - Right/Left: Right Pain - part of body: Hip     Time: 3474-2595 PT Time Calculation (min) (ACUTE ONLY): 60 min  Charges:  $Gait Training: 8-22 mins $Therapeutic Exercise: 8-22 mins $Therapeutic Activity: 38-52 mins                     Herminio Commons, PT, DPT 3:27 PM,04/15/21

## 2021-04-15 NOTE — Consult Note (Signed)
ORTHOPAEDIC CONSULTATION  REQUESTING PHYSICIAN: Wyvonnia Dusky, MD  Chief Complaint: Right superior and inferior pubic rami and sacral ala fractures status post fall  HPI: Suzanne Nunez is a 87 y.o. female who presented to the [facility] regional emergency department via EMS after an unwitnessed fall.  Patient lives with her granddaughter.  Patient was diagnosed with right superior and inferior pubic rami fractures as well as a sacral ala fracture confirmed by hip x-rays and a CT of the pelvis.  CT of the head was negative for intracranial abnormality.  Today the patient is seen in her hospital room.  An adult female companion is at the bedside but her granddaughter is not present.  Patient denies any significant right hip or pelvis pain resting in her bed.  She denies any numbness or tingling in the right lower extremity.  Patient denies other injuries.  History reviewed. No pertinent past medical history. History reviewed. No pertinent surgical history. Social History   Socioeconomic History   Marital status: Unknown    Spouse name: Not on file   Number of children: Not on file   Years of education: Not on file   Highest education level: Not on file  Occupational History   Not on file  Tobacco Use   Smoking status: Never   Smokeless tobacco: Never  Substance and Sexual Activity   Alcohol use: Not on file   Drug use: Not on file   Sexual activity: Not on file  Other Topics Concern   Not on file  Social History Narrative   Not on file   Social Determinants of Health   Financial Resource Strain: Not on file  Food Insecurity: Not on file  Transportation Needs: Not on file  Physical Activity: Not on file  Stress: Not on file  Social Connections: Not on file   History reviewed. No pertinent family history. No Known Allergies Prior to Admission medications   Medication Sig Start Date End Date Taking? Authorizing Provider  acetaminophen (TYLENOL) 500 MG tablet Take  500-1,000 mg by mouth every 6 (six) hours as needed for mild pain or moderate pain.   Yes [provider]  ascorbic acid (VITAMIN C) 1000 MG tablet Take 1,000 mg by mouth daily.   Yes [provider]  chlorthalidone (HYGROTON) 25 MG tablet Take 25 mg by mouth daily.   Yes [provider]  Cholecalciferol (SM VITAMIN D3) 100 MCG (4000 UT) CAPS Take 4,000 Units by mouth daily.   Yes [provider]  dorzolamide-timolol (COSOPT) 22.3-6.8 MG/ML ophthalmic solution Place 1 drop into both eyes 2 times daily at 12 noon and 4 pm.   Yes [provider]  Glucosamine-Chondroit-Vit C-Mn (GLUCOSAMINE CHONDR 1500 COMPLX) CAPS Take 2 capsules by mouth daily.   Yes [provider]  latanoprost (XALATAN) 0.005 % ophthalmic solution Place 1 drop into both eyes at bedtime.   Yes [provider]  letrozole (FEMARA) 2.5 MG tablet Take 2.5 mg by mouth daily.   Yes [provider]  magnesium oxide (MAG-OX) 400 MG tablet Take 400 mg by mouth daily.   Yes [provider]  rosuvastatin (CRESTOR) 20 MG tablet Take 20 mg by mouth at bedtime.   Yes [provider]  senna-docusate (SENOKOT-S) 8.6-50 MG tablet Take 1 tablet by mouth daily.   Yes [provider]  zinc sulfate 220 (50 Zn) MG capsule Take 220 mg by mouth at bedtime.   Yes [provider]   CT Head Wo Contrast  Result Date: 04/14/2021 CLINICAL DATA:  Trauma EXAM: CT HEAD WITHOUT CONTRAST TECHNIQUE: Contiguous axial images were obtained from the base of the skull through the vertex without intravenous contrast. COMPARISON:  None. FINDINGS: Brain: Chronic white matter ischemic change. No evidence of acute infarction, hemorrhage, hydrocephalus, extra-axial collection or mass lesion/mass effect. Vascular: No hyperdense vessel or unexpected calcification. Skull: Normal. Negative for fracture or focal lesion. Sinuses/Orbits: Partial opacification of the right sphenoid  sinus. No acute finding. Other: None. IMPRESSION: No acute intracranial abnormality. Electronically Signed   By: Yetta Glassman M.D.   On: 04/14/2021 11:39   CT PELVIS WO CONTRAST  Result Date: 04/14/2021 CLINICAL DATA:  Fall, evaluate right pubic fractures EXAM: CT PELVIS WITHOUT CONTRAST TECHNIQUE: Multidetector CT imaging of the pelvis was performed following the standard protocol without intravenous contrast. COMPARISON:  Same day radiographs of the right hip FINDINGS: Urinary Tract:  No abnormality visualized. Bowel:  Sigmoid diverticulosis. Vascular/Lymphatic: No pathologically enlarged lymph nodes. Aortic atherosclerosis. Reproductive:  No mass or other significant abnormality Other:  None. Musculoskeletal: No suspicious bone lesions identified. Osteopenia. Comminuted fractures of the right superior and inferior pubic rami about the pubic symphysis (series 2, image 81) with additional minimally displaced fracture of the mid right inferior pubic ramus (series 2, image 98). Additional minimally displaced fractures of the right sacral ala (series 2, image 37). IMPRESSION: 1. Comminuted fractures of the right superior and inferior pubic rami about the pubic symphysis with additional minimally displaced fracture of the mid right inferior pubic ramus, as seen on prior radiographs. No involvement of the acetabulum. 2. Additional minimally displaced fractures of the right sacral ala. Aortic Atherosclerosis (ICD10-I70.0). Electronically Signed   By: Delanna Ahmadi M.D.   On: 04/14/2021 13:20   DG Hip Unilat  With Pelvis 2-3 Views Right  Result Date: 04/14/2021 CLINICAL DATA:  Fall, hip pain EXAM: DG HIP (WITH OR WITHOUT PELVIS) 2-3V RIGHT COMPARISON:  None. FINDINGS: Acute comminuted fractures of the right parasymphyseal pubic bone and superior and inferior pubic rami, with minimal displacement of fracture fragments. No acute fracture of the proximal right femur. No hip dislocation. IMPRESSION: Acute  fractures of the right pubic bone. Electronically Signed   By: Ofilia Neas M.D.   On: 04/14/2021 12:01    Positive ROS: All other systems have been reviewed and were otherwise negative with the exception of those mentioned in the HPI and as above.  Physical Exam: General: Alert, no acute distress  MUSCULOSKELETAL: Right lower extremity: Patient skin is intact overlying the hip and pelvis.  There is no erythema ecchymosis or significant swelling.  Her thigh and leg compartments are soft and compressible.  She had mild pain with attempted active hip and knee flexion of approximately 40 to 45 degrees.  Distally she had palpable pedal pulses, intact sensation light touch and intact motor function.  There is no shortening or external rotation to her right lower extremity.  Assessment: Right superior and inferior pubic rami and sacral ala fractures status post fall  Plan: I explained to the patient and her companion that she will not require surgery for her fractures.  Her fractures are minimally displaced.  She may weight-bear as tolerated on the right lower extremity.  Patient understands that she may have pain for a few weeks.  She will continue physical therapy and use a walker for assistance with ambulation.  She will likely need a skilled nursing facility upon discharge.  Her fractures will take approximately 3 months to completely heal.  She may follow-up in our office in approximately 4 weeks for reevaluation and x-ray.   Thornton Park, MD    04/15/2021 1:17 PM

## 2021-04-15 NOTE — Progress Notes (Signed)
Patient arrived in stable condition. Vitals WNL, A&O x4. No pain reported. Discussed plan of care with granddaughter and pt. Call bell usage explained and within reach. Bed alarms on.

## 2021-04-15 NOTE — Progress Notes (Signed)
PHARMACIST - PHYSICIAN ORDER COMMUNICATION  CONCERNING: P&T Medication Policy on Herbal Medications  DESCRIPTION:  This patient's order for:  Glucosamine-Chondroitin  has been noted.  This product(s) is classified as an "herbal" or natural product. Due to a lack of definitive safety studies or FDA approval, nonstandard manufacturing practices, plus the potential risk of unknown drug-drug interactions while on inpatient medications, the Pharmacy and Therapeutics Committee does not permit the use of "herbal" or natural products of this type within [facility].   ACTION TAKEN: The pharmacy department is unable to verify this order at this time and your patient has been informed of this safety policy. Please reevaluate patient's clinical condition at discharge and address if the herbal or natural product(s) should be resumed at that time.   

## 2021-04-15 NOTE — TOC Initial Note (Signed)
Transition of Care [facility]) - Initial/Assessment Note    Patient Details  Name: Suzanne Nunez MRN: [id number] Date of Birth: Jan 23, 1932  Transition of Care [facility]) CM/SW Contact:    Magnus Ivan, LCSW Phone Number: 04/15/2021, 1:28 PM  Clinical Narrative:                Spoke to granddaughter Lauren regarding SNF rec. Patient lives with granddaughter in [place]. PCP is [facility] in [place] where patient previously lived. Patient had OPPT in the past.  Patient has had 1 [employer] COVID vaccine and 1 Moderna booster. Lauren stated they are agreeable to SNF rec, understands it is based on availability. [facility] would be her top choice. CSW sent medicare.gov link as well for Lauren to review and call back with other top choices.  Lauren is also trying to find patient's SS# so that CSW can complete PASRR screening.   Expected Discharge Plan: Skilled Nursing Facility Barriers to Discharge: Continued Medical Work up   Patient Goals and CMS Choice Patient states their goals for this hospitalization and ongoing recovery are:: SNF CMS Medicare.gov Compare Post Acute Care list provided to:: Patient Represenative (must comment) Choice offered to / list presented to : Adult Children (granddaughter)  Expected Discharge Plan and Services Expected Discharge Plan: Experiment arrangements for the past 2 months: Single Family Home                                      Prior Living Arrangements/Services Living arrangements for the past 2 months: Single Family Home Lives with:: Relatives Patient language and need for interpreter reviewed:: Yes        Need for Family Participation in Patient Care: Yes (Comment) Care giver support system in place?: Yes (comment)   Criminal Activity/Legal Involvement Pertinent to Current Situation/Hospitalization: No - Comment as needed  Activities of Daily Living Home Assistive Devices/Equipment: Gilford Rile  (specify type) ADL Screening (condition at time of admission) Patient's cognitive ability adequate to safely complete daily activities?: Yes Is the patient deaf or have difficulty hearing?: Yes Does the patient have difficulty seeing, even when wearing glasses/contacts?: Yes Does the patient have difficulty concentrating, remembering, or making decisions?: Yes Patient able to express need for assistance with ADLs?: No Does the patient have difficulty dressing or bathing?: Yes Independently performs ADLs?: No Does the patient have difficulty walking or climbing stairs?: Yes Weakness of Legs: Right Weakness of Arms/Hands: Both  Permission Sought/Granted Permission sought to share information with : Facility Art therapist granted to share information with : Yes, Verbal Permission Granted (by granddaughter)     Permission granted to share info w AGENCY: SNFs        Emotional Assessment       Orientation: : Fluctuating Orientation (Suspected and/or reported Sundowners) Alcohol / Substance Use: Not Applicable Psych Involvement: No (comment)  Admission diagnosis:  Hip fracture ([place]) [S72.009A] Fall, initial encounter [W19.XXXA] Multiple closed fractures of pelvis without disruption of pelvic ring, initial encounter [facility]) [S32.82XA] Patient Active Problem List   Diagnosis Date Noted   Hip fracture ([place]) 04/14/2021   PCP:  Kathyrn Lass Pharmacy:   [facility] [address] [place] [address] Phone: [phone] Fax: [phone]     Social Determinants of Health (SDOH) Interventions    Readmission Risk Interventions  No flowsheet data found.

## 2021-04-15 NOTE — TOC CM/SW Note (Signed)
Attempted call to granddaughter Lauren regarding SNF rec. Left a VM requesting a return call.  Oleh Genin, [place]

## 2021-04-15 NOTE — Progress Notes (Signed)
PROGRESS NOTE    Suzanne Nunez  FXT:[id number] DOB: 01/31/1932 DOA: 04/14/2021 PCP: Pcp, No  Assessment & Plan:   Principal Problem:   Hip fracture ([place])   Pelvic fractures: of right superior & inferior pubic rami as per CT scan.  Secondary to fall at home. Not operative candidate. Weightbearing as tolerated. PT/OT consulted   HTN: continue chlorthalidone  Glaucoma: continue dorzolamide, latanoprost  HLD: continue on statin    DVT prophylaxis: lovenox  Code Status: full Family Communication:  Disposition Plan: depends on OT/PT recs  Level of care: Med-Surg  Status is: Inpatient  Remains inpatient appropriate because: severity of illness, has pelvic fractures and OT/PT need to see the pt     Consultants:    Procedures:   Antimicrobials:   Subjective: Pt c/o weakness   Objective: Vitals:   04/15/21 0536 04/15/21 0636 04/15/21 0800 04/15/21 0900  BP: (!) 150/65 (!) 133/56  136/60  Pulse: (!) 36 66 61 74  Resp: 16 18  16   Temp: 97.8 F (36.6 C) 98.1 F (36.7 C)  97.9 F (36.6 C)  TempSrc: Oral Oral    SpO2: 93% 100%  96%  Weight:      Height:        Intake/Output Summary (Last 24 hours) at 04/15/2021 1037 Last data filed at 04/15/2021 2992 Gross per 24 hour  Intake --  Output 600 ml  Net -600 ml   Filed Weights   04/14/21 1015  Weight: 59 kg    Examination:  General exam: Appears calm and comfortable  Respiratory system: Clear to auscultation. Respiratory effort normal. Cardiovascular system: S1 & S2 +. No rubs, gallops or clicks.  Gastrointestinal system: Abdomen is nondistended, soft and nontender. Normal bowel sounds heard. Central nervous system: Alert and oriented. Moves all extremities  Psychiatry: Judgement and insight appear normal. Flat mood and affect    Data Reviewed: I have personally reviewed following labs and imaging studies  CBC: Recent Labs  Lab 04/14/21 1440 04/15/21 0806  WBC 14.8* 8.7  NEUTROABS 12.6*  --    HGB 13.7 12.5  HCT 40.5 37.2  MCV 92.3 93.2  PLT 309 426   Basic Metabolic Panel: Recent Labs  Lab 04/14/21 1440 04/15/21 0806  NA 130* 128*  K 3.2* 3.6  CL 92* 90*  CO2 29 31  GLUCOSE 137* 117*  BUN 21 26*  CREATININE 0.70 1.00  CALCIUM 9.4 8.8*   GFR: Estimated Creatinine Clearance: 32.8 mL/min (by C-G formula based on SCr of 1 mg/dL). Liver Function Tests: No results for input(s): AST, ALT, ALKPHOS, BILITOT, PROT, ALBUMIN in the last 168 hours. No results for input(s): LIPASE, AMYLASE in the last 168 hours. No results for input(s): AMMONIA in the last 168 hours. Coagulation Profile: No results for input(s): INR, PROTIME in the last 168 hours. Cardiac Enzymes: No results for input(s): CKTOTAL, CKMB, CKMBINDEX, TROPONINI in the last 168 hours. BNP (last 3 results) No results for input(s): PROBNP in the last 8760 hours. HbA1C: No results for input(s): HGBA1C in the last 72 hours. CBG: No results for input(s): GLUCAP in the last 168 hours. Lipid Profile: No results for input(s): CHOL, HDL, LDLCALC, TRIG, CHOLHDL, LDLDIRECT in the last 72 hours. Thyroid Function Tests: No results for input(s): TSH, T4TOTAL, FREET4, T3FREE, THYROIDAB in the last 72 hours. Anemia Panel: No results for input(s): VITAMINB12, FOLATE, FERRITIN, TIBC, IRON, RETICCTPCT in the last 72 hours. Sepsis Labs: No results for input(s): PROCALCITON, LATICACIDVEN in the last 168 hours.  Recent Results (from the past 240 hour(s))  Resp Panel by RT-PCR (Flu A&B, Covid) Nasopharyngeal Swab     Status: None   Collection Time: 04/14/21  2:56 PM   Specimen: Nasopharyngeal Swab; Nasopharyngeal(NP) swabs in vial transport medium  Result Value Ref Range Status   SARS Coronavirus 2 by RT PCR NEGATIVE NEGATIVE Final    Comment: (NOTE) SARS-CoV-2 target nucleic acids are NOT DETECTED.  The SARS-CoV-2 RNA is generally detectable in upper respiratory specimens during the acute phase of infection. The  lowest concentration of SARS-CoV-2 viral copies this assay can detect is 138 copies/mL. A negative result does not preclude SARS-Cov-2 infection and should not be used as the sole basis for treatment or other patient management decisions. A negative result may occur with  improper specimen collection/handling, submission of specimen other than nasopharyngeal swab, presence of viral mutation(s) within the areas targeted by this assay, and inadequate number of viral copies(<138 copies/mL). A negative result must be combined with clinical observations, patient history, and epidemiological information. The expected result is Negative.  Fact Sheet for Patients:  EntrepreneurPulse.com.au  Fact Sheet for Healthcare Providers:  IncredibleEmployment.be  This test is no t yet approved or cleared by the [place] FDA and  has been authorized for detection and/or diagnosis of SARS-CoV-2 by FDA under an Emergency Use Authorization (EUA). This EUA will remain  in effect (meaning this test can be used) for the duration of the COVID-19 declaration under Section 564(b)(1) of the Act, 21 U.S.C.section 360bbb-3(b)(1), unless the authorization is terminated  or revoked sooner.       Influenza A by PCR NEGATIVE NEGATIVE Final   Influenza B by PCR NEGATIVE NEGATIVE Final    Comment: (NOTE) The Xpert Xpress SARS-CoV-2/FLU/RSV plus assay is intended as an aid in the diagnosis of influenza from Nasopharyngeal swab specimens and should not be used as a sole basis for treatment. Nasal washings and aspirates are unacceptable for Xpert Xpress SARS-CoV-2/FLU/RSV testing.  Fact Sheet for Patients: EntrepreneurPulse.com.au  Fact Sheet for Healthcare Providers: IncredibleEmployment.be  This test is not yet approved or cleared by the [place] FDA and has been authorized for detection and/or diagnosis of SARS-CoV-2 by FDA under  an Emergency Use Authorization (EUA). This EUA will remain in effect (meaning this test can be used) for the duration of the COVID-19 declaration under Section 564(b)(1) of the Act, 21 U.S.C. section 360bbb-3(b)(1), unless the authorization is terminated or revoked.  Performed at [facility], [address], [place], [place] [address]          Radiology Studies: CT Head Wo Contrast  Result Date: 04/14/2021 CLINICAL DATA:  Trauma EXAM: CT HEAD WITHOUT CONTRAST TECHNIQUE: Contiguous axial images were obtained from the base of the skull through the vertex without intravenous contrast. COMPARISON:  None. FINDINGS: Brain: Chronic white matter ischemic change. No evidence of acute infarction, hemorrhage, hydrocephalus, extra-axial collection or mass lesion/mass effect. Vascular: No hyperdense vessel or unexpected calcification. Skull: Normal. Negative for fracture or focal lesion. Sinuses/Orbits: Partial opacification of the right sphenoid sinus. No acute finding. Other: None. IMPRESSION: No acute intracranial abnormality. Electronically Signed   By: Yetta Glassman M.D.   On: 04/14/2021 11:39   CT PELVIS WO CONTRAST  Result Date: 04/14/2021 CLINICAL DATA:  Fall, evaluate right pubic fractures EXAM: CT PELVIS WITHOUT CONTRAST TECHNIQUE: Multidetector CT imaging of the pelvis was performed following the standard protocol without intravenous contrast. COMPARISON:  Same day radiographs of the right hip FINDINGS: Urinary Tract:  No abnormality visualized. Bowel:  Sigmoid diverticulosis. Vascular/Lymphatic: No pathologically enlarged lymph nodes. Aortic atherosclerosis. Reproductive:  No mass or other significant abnormality Other:  None. Musculoskeletal: No suspicious bone lesions identified. Osteopenia. Comminuted fractures of the right superior and inferior pubic rami about the pubic symphysis (series 2, image 81) with additional minimally displaced fracture of the mid right inferior pubic  ramus (series 2, image 98). Additional minimally displaced fractures of the right sacral ala (series 2, image 37). IMPRESSION: 1. Comminuted fractures of the right superior and inferior pubic rami about the pubic symphysis with additional minimally displaced fracture of the mid right inferior pubic ramus, as seen on prior radiographs. No involvement of the acetabulum. 2. Additional minimally displaced fractures of the right sacral ala. Aortic Atherosclerosis (ICD10-I70.0). Electronically Signed   By: Delanna Ahmadi M.D.   On: 04/14/2021 13:20   DG Hip Unilat  With Pelvis 2-3 Views Right  Result Date: 04/14/2021 CLINICAL DATA:  Fall, hip pain EXAM: DG HIP (WITH OR WITHOUT PELVIS) 2-3V RIGHT COMPARISON:  None. FINDINGS: Acute comminuted fractures of the right parasymphyseal pubic bone and superior and inferior pubic rami, with minimal displacement of fracture fragments. No acute fracture of the proximal right femur. No hip dislocation. IMPRESSION: Acute fractures of the right pubic bone. Electronically Signed   By: Ofilia Neas M.D.   On: 04/14/2021 12:01        Scheduled Meds:  ascorbic acid  1,000 mg Oral Daily   chlorthalidone  25 mg Oral Daily   cholecalciferol  4,000 Units Oral Daily   dorzolamide  1 drop Both Eyes Q12H   enoxaparin (LOVENOX) injection  40 mg Subcutaneous Q24H   Glucosamine Chondr 1500 Complx  2 capsule Oral Daily   latanoprost  1 drop Both Eyes QHS   magnesium oxide  400 mg Oral Daily   rosuvastatin  20 mg Oral QHS   Continuous Infusions:  methocarbamol (ROBAXIN) IV       LOS: 1 day    Time spent: 32 mins    Wyvonnia Dusky, MD [facility] Pager [phone]  If 7PM-7AM, please contact night-coverage 04/15/2021, 10:37 AM

## 2021-04-15 NOTE — Evaluation (Signed)
Physical Therapy Evaluation Patient Details Name: Suzanne Nunez MRN: [id number] DOB: 05-Feb-1932 Today's Date: 04/15/2021  History of Present Illness  Pt admitted to [facility] on [facility]/14/21 for mechanical fall that resulted in R superior and inferior pubic rami fracture. Pt not an operative candidate; to proceed with WBAT. Significant PMH includes: dementia and HTN.   Clinical Impression  Pt is a 87 year old F admitted to hospital on 04/14/21 for mechanical fall that resulted in R pelvic fracture. PLOF/home set up provided by Granddaughter, Lauren, at bedside due to pt hx of dementia. At baseline, pt required assist for ADL's, IADL's, and has a hx of falls; pt declining to use RW/SPC for balance, and was therefore using wall/furniture for steadying. Pt presents with generalized weakness, increased pain levels, impaired processing, decreased activity tolerance, decreased standing balance, and poor safety awareness, resulting in impaired functional mobility from baseline. Due to deficits, pt required mod assist +2 for bed mobility, mod-max assist for transfers, and max assist to take 2 steps at bedside with RW. Due to increased pain and inability to WB through RLE, pt unable to take steps with LLE and required max assist for RLE facilitation for lateral steps. Per granddaughter report, pt with recent double mastectomy and is limited with shoulder AROM and has significant tenderness under bil arms. Deficits limit the pt's ability to safely and independently perform ADL's, transfer, and ambulate. Pt will benefit from acute skilled PT services to address deficits for return to baseline function. At this time, PT recommends SNF rehab at [place] to address deficits and improve overall safety with functional mobility prior to return home. Pt and granddaughter agreeable. Will defer DME needs to post acute rehab, however, granddaughter expressing interest in [place] for home use.        Recommendations for follow up therapy are  one component of a multi-disciplinary discharge planning process, led by the attending physician.  Recommendations may be updated based on patient status, additional functional criteria and insurance authorization.  Follow Up Recommendations Skilled nursing-short term rehab (<3 hours/day)    Assistance Recommended at Discharge Intermittent Supervision/Assistance  Functional Status Assessment Patient has had a recent decline in their functional status and demonstrates the ability to make significant improvements in function in a reasonable and predictable amount of time.   Equipment Recommendations  BSC/3in1 (defer to post acute; granddaughter would like BSC for home)       Precautions / Restrictions Precautions Precautions: Fall Restrictions Weight Bearing Restrictions: Yes RLE Weight Bearing: Weight bearing as tolerated      Mobility  Bed Mobility Overal bed mobility: Needs Assistance Bed Mobility: Supine to Sit;Sit to Supine     Supine to sit: Mod assist;+2 for safety/equipment;HOB elevated Sit to supine: Mod assist;+2 for safety/equipment   General bed mobility comments: for trunk/BLE facilitation to sit EOB; multimodal cues for safety, sequencing, and pain management; increased time/effort secondary to pain    Transfers Overall transfer level: Needs assistance Equipment used: Rolling walker (2 wheels) Transfers: Sit to/from Stand Sit to Stand: Mod assist;Max assist;+2 physical assistance;From elevated surface           General transfer comment: for power to stand from EOB with RW; multimodal cues for set up, sequencing, and hand placement; increased time/effort to achieve full upright standing with cues for hip ext    Ambulation/Gait Ambulation/Gait assistance: Max assist Gait Distance (Feet): 1 Feet (2 lateral steps with R at bedside; unable to take a step with LLE) Assistive device: Rolling walker (2  wheels)         General Gait Details: Required CGA-min  assist for balance but max assist for RLE advancement. Unable to step with LLE secondary to pain with RLE WB. Required 2 trials, taking 1 step each time with assist from PT.      Balance Overall balance assessment: Needs assistance Sitting-balance support: Bilateral upper extremity supported;Feet unsupported Sitting balance-Leahy Scale: Fair Sitting balance - Comments: no LOB at EOB   Standing balance support: Bilateral upper extremity supported;During functional activity Standing balance-Leahy Scale: Poor Standing balance comment: in RW, unable to tolerate RLE WB                             Pertinent Vitals/Pain Pain Assessment: Faces Faces Pain Scale: Hurts even more Pain Location: RLE with WB Pain Intervention(s): Limited activity within patient's tolerance;Monitored during session;Repositioned;Patient requesting pain meds-RN notified    Home Living Family/patient expects to be discharged to:: Private residence Living Arrangements: Other (Comment) (grandaughter - Catalina Lunger) Available Help at Discharge: Family;Available PRN/intermittently (granddaughter works from home and unable to provide 24/7 care) Type of Home: House Home Access: Stairs to enter   [employer] of Steps: 1     Home Equipment: Advice worker (2 wheels);Cane - single point Additional Comments: lift chair; working on Scientist, research (physical sciences) bars for bathroom    Prior Function Prior Level of Function : Needs assist       Physical Assist : Mobility (physical);ADLs (physical) Mobility (physical): Gait ADLs (physical): Grooming;Bathing;Dressing;IADLs Mobility Comments: Granddaughter reports that pt has become increasingly unsteady, requiring use of walls/furniture for steadying as she refuses to use RW/SPC for gait. ADLs Comments: Due to dementia and balance, granddaughter assists with ADL's and IADL's. Pt with recent double mastectomy and is limited with shoulder flexion AROM  and has significant tenderness to bil underarms.        Extremity/Trunk Assessment   Upper Extremity Assessment Upper Extremity Assessment: Defer to OT evaluation    Lower Extremity Assessment Lower Extremity Assessment: Generalized weakness (unable to formally assess due to pain; required assist for RLE facilitation during gait due to weakness/pain)       Communication   Communication: No difficulties  Cognition Arousal/Alertness: Awake/alert Behavior During Therapy: WFL for tasks assessed/performed Overall Cognitive Status: History of cognitive impairments - at baseline                                 General Comments: hx of dementia at baseline; A&O x3, unable to state time (which granddaughter reports is baseline). Able to follow simple 2-step commands        General Comments General comments (skin integrity, edema, etc.): rug burn to L knee from previous fall; increased pain behaviors with mobility    Exercises Other Exercises Other Exercises: Participated in bed mobility, transfers, and taking 2 steps at bedside. Increased assist for all mobility secondary to pain and weakness. Other Exercises: Pt and granddaughter educated re: PT role/POC, DC recommendations, pain management, breathing techniques, WB precautions, benefits of OOB mobility, and safety with mobility.   Assessment/Plan    PT Assessment Patient needs continued PT services  PT Problem List Decreased strength;Decreased range of motion;Decreased activity tolerance;Decreased balance;Decreased mobility;Decreased cognition;Decreased safety awareness;Pain       PT Treatment Interventions DME instruction;Gait training;Stair training;Functional mobility training;Therapeutic activities;Therapeutic exercise;Balance training;Neuromuscular re-education    PT Goals (Current goals can  be found in the Care Plan section)  Acute Rehab PT Goals Patient Stated Goal: "to walk" PT Goal Formulation: With  patient/family Time For Goal Achievement: 04/29/21 Potential to Achieve Goals: Good    Frequency 7X/week   Barriers to discharge Decreased caregiver support PRN care from granddaughter at home    Co-evaluation PT/OT/SLP Co-Evaluation/Treatment: Yes Reason for Co-Treatment: Complexity of the patient's impairments (multi-system involvement);For patient/therapist safety;To address functional/ADL transfers PT goals addressed during session: Mobility/safety with mobility;Balance OT goals addressed during session: ADL's and self-care;Proper use of Adaptive equipment and DME       AM-PAC PT "6 Clicks" Mobility  Outcome Measure Help needed turning from your back to your side while in a flat bed without using bedrails?: A Lot Help needed moving from lying on your back to sitting on the side of a flat bed without using bedrails?: A Lot Help needed moving to and from a bed to a chair (including a wheelchair)?: A Lot Help needed standing up from a chair using your arms (e.g., wheelchair or bedside chair)?: A Lot Help needed to walk in hospital room?: A Lot Help needed climbing 3-5 steps with a railing? : Total 6 Click Score: 11    End of Session Equipment Utilized During Treatment: Gait belt Activity Tolerance: Patient limited by pain Patient left: in bed;with call bell/phone within reach;with bed alarm set;with family/visitor present (bed in chair position with pillow under BLE) Nurse Communication: Mobility status;Patient requests pain meds PT Visit Diagnosis: Unsteadiness on feet (R26.81);Repeated falls (R29.6);Muscle weakness (generalized) (M62.81);Difficulty in walking, not elsewhere classified (R26.2);Pain Pain - Right/Left: Right Pain - part of body: Hip    Time: 8466-5993 PT Time Calculation (min) (ACUTE ONLY): 33 min   Charges:   PT Evaluation $PT Eval Moderate Complexity: 1 Mod PT Treatments $Gait Training: 8-22 mins        Herminio Commons, PT, DPT 12:22  PM,04/15/21

## 2021-04-15 NOTE — NC FL2 (Addendum)
[place] LEVEL OF CARE SCREENING TOOL     IDENTIFICATION  Patient Name: Suzanne Nunez Birthdate: Jun 15, 1931 Sex: female Admission Date (Current Location): 04/14/2021  [facility] and [place] Number:  Engineering geologist and Address:  [facility], [address], [place], [place] [address]      Provider Number: [id number]  Attending Physician Name and Address:  Wyvonnia Dusky, MD  Relative Name and Phone Number:  Penni Bombard (Granddaughter)   [phone] [facility])    Current Level of Care: Hospital Recommended Level of Care: [place] Prior Approval Number:    Date Approved/Denied:   PASRR Number:  [id number]  Discharge Plan:      Current Diagnoses: Patient Active Problem List   Diagnosis Date Noted   Hip fracture ([place]) 04/14/2021    Orientation RESPIRATION BLADDER Height & Weight     Self, Situation, Place  Normal Incontinent Weight: 130 lb (59 kg) Height:  5\' 3"  (160 cm)  BEHAVIORAL SYMPTOMS/MOOD NEUROLOGICAL BOWEL NUTRITION STATUS        Diet (regular diet, thin liquids)  AMBULATORY STATUS COMMUNICATION OF NEEDS Skin   Extensive Assist Verbally Skin abrasions                       Personal Care Assistance Level of Assistance  Bathing, Feeding, Dressing Bathing Assistance: Maximum assistance Feeding assistance: Limited assistance Dressing Assistance: Maximum assistance     Functional Limitations Info             SPECIAL CARE FACTORS FREQUENCY  PT (By licensed PT), OT (By licensed OT)     PT Frequency: 5 times per week OT Frequency: 5 times per week            Contractures      Additional Factors Info  Allergies, Code Status Code Status Info: full Allergies Info: nka           Current Medications (04/15/2021):  This is the current hospital active medication list Current Facility-Administered Medications  Medication Dose Route Frequency Provider Last Rate Last Admin    acetaminophen (TYLENOL) tablet 1,000 mg  1,000 mg Oral Q6H PRN Wyvonnia Dusky, MD   1,000 mg at 04/15/21 1117   ascorbic acid (VITAMIN C) tablet 1,000 mg  1,000 mg Oral Daily Clarnce Flock, MD   1,000 mg at 04/15/21 1660   chlorthalidone (HYGROTON) tablet 25 mg  25 mg Oral Daily Clarnce Flock, MD   25 mg at 04/15/21 6301   cholecalciferol (VITAMIN D3) tablet 4,000 Units  4,000 Units Oral Daily Clarnce Flock, MD   4,000 Units at 04/15/21 0851   dorzolamide (TRUSOPT) 2 % ophthalmic solution 1 drop  1 drop Both Eyes Q12H Clarnce Flock, MD       enoxaparin (LOVENOX) injection 40 mg  40 mg Subcutaneous Q24H Clarnce Flock, MD   40 mg at 04/14/21 2253   HYDROcodone-acetaminophen (NORCO/VICODIN) 5-325 MG per tablet 1-2 tablet  1-2 tablet Oral Q6H PRN Clarnce Flock, MD   1 tablet at 04/14/21 2303   latanoprost (XALATAN) 0.005 % ophthalmic solution 1 drop  1 drop Both Eyes QHS Clarnce Flock, MD   1 drop at 04/14/21 2303   letrozole [facility]) tablet 2.5 mg  2.5 mg Oral Daily Wyvonnia Dusky, MD   2.5 mg at 04/15/21 1250   magnesium oxide (MAG-OX) tablet 400 mg  400 mg Oral Daily Clarnce Flock, MD   400  mg at 04/15/21 0851   methocarbamol (ROBAXIN) tablet 500 mg  500 mg Oral Q6H PRN Clarnce Flock, MD       Or   methocarbamol (ROBAXIN) 500 mg in dextrose 5 % 50 mL IVPB  500 mg Intravenous Q6H PRN Clarnce Flock, MD       morphine 2 MG/ML injection 0.5 mg  0.5 mg Intravenous Q2H PRN Clarnce Flock, MD       polyethylene glycol (MIRALAX / GLYCOLAX) packet 17 g  17 g Oral Daily PRN Clarnce Flock, MD       rosuvastatin (CRESTOR) tablet 20 mg  20 mg Oral QHS Clarnce Flock, MD   20 mg at 04/14/21 2248   traZODone (DESYREL) tablet 25 mg  25 mg Oral QHS PRN Clarnce Flock, MD         Discharge Medications: Please see discharge summary for a list of discharge medications.  Relevant Imaging Results:  Relevant Lab Results:   Additional  Information SS #: [id number]  [place], LCSW

## 2021-04-16 DIAGNOSIS — I10 Essential (primary) hypertension: Secondary | ICD-10-CM | POA: Diagnosis not present

## 2021-04-16 DIAGNOSIS — S72001A Fracture of unspecified part of neck of right femur, initial encounter for closed fracture: Secondary | ICD-10-CM | POA: Diagnosis not present

## 2021-04-16 DIAGNOSIS — S3282XA Multiple fractures of pelvis without disruption of pelvic ring, initial encounter for closed fracture: Secondary | ICD-10-CM | POA: Diagnosis not present

## 2021-04-16 DIAGNOSIS — E876 Hypokalemia: Secondary | ICD-10-CM

## 2021-04-16 DIAGNOSIS — S72009A Fracture of unspecified part of neck of unspecified femur, initial encounter for closed fracture: Secondary | ICD-10-CM | POA: Diagnosis not present

## 2021-04-16 LAB — BASIC METABOLIC PANEL
Anion gap: 7 (ref 5–15)
BUN: 17 mg/dL (ref 8–23)
CO2: 30 mmol/L (ref 22–32)
Calcium: 8.9 mg/dL (ref 8.9–10.3)
Chloride: 90 mmol/L — ABNORMAL LOW (ref 98–111)
Creatinine, Ser: 0.81 mg/dL (ref 0.44–1.00)
GFR, Estimated: >60 mL/min (ref >60)
Glucose, Bld: 125 mg/dL — ABNORMAL HIGH (ref 70–99)
Potassium: 3.1 mmol/L — ABNORMAL LOW (ref 3.5–5.1)
Sodium: 127 mmol/L — ABNORMAL LOW (ref 135–145)

## 2021-04-16 LAB — CBC
HCT: 38.3 % (ref 36.0–46.0)
Hemoglobin: 13 g/dL (ref 12.0–15.0)
MCH: 31.9 pg (ref 26.0–34.0)
MCHC: 33.9 g/dL (ref 30.0–36.0)
MCV: 93.9 fL (ref 80.0–100.0)
Platelets: 283 10*3/uL (ref 150–400)
RBC: 4.08 MIL/uL (ref 3.87–5.11)
RDW: 13.1 % (ref 11.5–15.5)
WBC: 9.4 10*3/uL (ref 4.0–10.5)
nRBC: 0 % (ref 0.0–0.2)

## 2021-04-16 MED ORDER — SODIUM CHLORIDE 1 G PO TABS
1.0000 g | ORAL_TABLET | Freq: Two times a day (BID) | ORAL | Status: DC
  Administered 2021-04-16 – 2021-04-17 (×3): 1 g via ORAL
  Filled 2021-04-16 (×4): qty 1

## 2021-04-16 MED ORDER — POTASSIUM CHLORIDE CRYS ER 20 MEQ PO TBCR
40.0000 meq | EXTENDED_RELEASE_TABLET | Freq: Two times a day (BID) | ORAL | Status: AC
  Administered 2021-04-16 (×2): 40 meq via ORAL
  Filled 2021-04-16 (×2): qty 2

## 2021-04-16 NOTE — Progress Notes (Signed)
PROGRESS NOTE    Suzanne Nunez  TIR:[id number] DOB: 02/05/1932 DOA: 04/14/2021 PCP: Pcp, No  Assessment & Plan:   Principal Problem:   Hip fracture ([place])   Pelvic fractures: of right superior & inferior pubic rami as per CT scan.   Secondary to fall at home. Not operative candidate. Weightbearing as tolerated. PT/OT recs SNF   Hypokalemia: KCl repleated  Hyponatremia: will start NaCl tabs.   HTN: continue on chlorthalidone   Glaucoma: continue on home dose of dorzolamide, latanoprost, & timolol   HLD: continue on statin    DVT prophylaxis: lovenox  Code Status: full Family Communication: discussed pt's care w/ pt's family at bedside and answered their questions  Disposition Plan: PT/OT recs SNF   Level of care: Med-Surg  Status is: Inpatient  Remains inpatient appropriate because: severity of illness, waiting on SNF placement     Consultants:  Ortho surg  Procedures:   Antimicrobials:   Subjective: Pt c/o intermittent pelvic pain  Objective: Vitals:   04/15/21 1535 04/15/21 2116 04/16/21 0433 04/16/21 0729  BP: (!) 142/49 130/67 (!) 154/59 137/66  Pulse: 80 74 62 69  Resp: 16 16 16 16   Temp: 98.3 F (36.8 C) 98.8 F (37.1 C) 98.5 F (36.9 C) 98.6 F (37 C)  TempSrc:      SpO2: 96% 96% 95% 98%  Weight:      Height:        Intake/Output Summary (Last 24 hours) at 04/16/2021 0742 Last data filed at 04/16/2021 0548 Gross per 24 hour  Intake 840 ml  Output 950 ml  Net -110 ml   Filed Weights   04/14/21 1015  Weight: 59 kg    Examination:  General exam: Appears comfortable  Respiratory system: clear breath sounds b/l Cardiovascular system: S1/S2+. No rubs or clicks  Gastrointestinal system: Abd is soft, NT, ND & hypoactive bowel sounds  Central nervous system: Alert and awake. Moves all extremities   Psychiatry: judgement and insight appears poor. Flat mood and affect     Data Reviewed: I have personally reviewed following labs and  imaging studies  CBC: Recent Labs  Lab 04/14/21 1440 04/15/21 0806 04/16/21 0628  WBC 14.8* 8.7 9.4  NEUTROABS 12.6*  --   --   HGB 13.7 12.5 13.0  HCT 40.5 37.2 38.3  MCV 92.3 93.2 93.9  PLT 309 278 676   Basic Metabolic Panel: Recent Labs  Lab 04/14/21 1440 04/15/21 0806 04/16/21 0628  NA 130* 128* 127*  K 3.2* 3.6 3.1*  CL 92* 90* 90*  CO2 29 31 30   GLUCOSE 137* 117* 125*  BUN 21 26* 17  CREATININE 0.70 1.00 0.81  CALCIUM 9.4 8.8* 8.9   GFR: Estimated Creatinine Clearance: 40.5 mL/min (by C-G formula based on SCr of 0.81 mg/dL). Liver Function Tests: No results for input(s): AST, ALT, ALKPHOS, BILITOT, PROT, ALBUMIN in the last 168 hours. No results for input(s): LIPASE, AMYLASE in the last 168 hours. No results for input(s): AMMONIA in the last 168 hours. Coagulation Profile: No results for input(s): INR, PROTIME in the last 168 hours. Cardiac Enzymes: No results for input(s): CKTOTAL, CKMB, CKMBINDEX, TROPONINI in the last 168 hours. BNP (last 3 results) No results for input(s): PROBNP in the last 8760 hours. HbA1C: No results for input(s): HGBA1C in the last 72 hours. CBG: No results for input(s): GLUCAP in the last 168 hours. Lipid Profile: No results for input(s): CHOL, HDL, LDLCALC, TRIG, CHOLHDL, LDLDIRECT in the last 72 hours. Thyroid  Function Tests: No results for input(s): TSH, T4TOTAL, FREET4, T3FREE, THYROIDAB in the last 72 hours. Anemia Panel: No results for input(s): VITAMINB12, FOLATE, FERRITIN, TIBC, IRON, RETICCTPCT in the last 72 hours. Sepsis Labs: No results for input(s): PROCALCITON, LATICACIDVEN in the last 168 hours.  Recent Results (from the past 240 hour(s))  Resp Panel by RT-PCR (Flu A&B, Covid) Nasopharyngeal Swab     Status: None   Collection Time: 04/14/21  2:56 PM   Specimen: Nasopharyngeal Swab; Nasopharyngeal(NP) swabs in vial transport medium  Result Value Ref Range Status   SARS Coronavirus 2 by RT PCR NEGATIVE NEGATIVE  Final    Comment: (NOTE) SARS-CoV-2 target nucleic acids are NOT DETECTED.  The SARS-CoV-2 RNA is generally detectable in upper respiratory specimens during the acute phase of infection. The lowest concentration of SARS-CoV-2 viral copies this assay can detect is 138 copies/mL. A negative result does not preclude SARS-Cov-2 infection and should not be used as the sole basis for treatment or other patient management decisions. A negative result may occur with  improper specimen collection/handling, submission of specimen other than nasopharyngeal swab, presence of viral mutation(s) within the areas targeted by this assay, and inadequate number of viral copies(<138 copies/mL). A negative result must be combined with clinical observations, patient history, and epidemiological information. The expected result is Negative.  Fact Sheet for Patients:  EntrepreneurPulse.com.au  Fact Sheet for Healthcare Providers:  IncredibleEmployment.be  This test is no t yet approved or cleared by the [place] FDA and  has been authorized for detection and/or diagnosis of SARS-CoV-2 by FDA under an Emergency Use Authorization (EUA). This EUA will remain  in effect (meaning this test can be used) for the duration of the COVID-19 declaration under Section 564(b)(1) of the Act, 21 U.S.C.section 360bbb-3(b)(1), unless the authorization is terminated  or revoked sooner.       Influenza A by PCR NEGATIVE NEGATIVE Final   Influenza B by PCR NEGATIVE NEGATIVE Final    Comment: (NOTE) The Xpert Xpress SARS-CoV-2/FLU/RSV plus assay is intended as an aid in the diagnosis of influenza from Nasopharyngeal swab specimens and should not be used as a sole basis for treatment. Nasal washings and aspirates are unacceptable for Xpert Xpress SARS-CoV-2/FLU/RSV testing.  Fact Sheet for Patients: EntrepreneurPulse.com.au  Fact Sheet for Healthcare  Providers: IncredibleEmployment.be  This test is not yet approved or cleared by the [place] FDA and has been authorized for detection and/or diagnosis of SARS-CoV-2 by FDA under an Emergency Use Authorization (EUA). This EUA will remain in effect (meaning this test can be used) for the duration of the COVID-19 declaration under Section 564(b)(1) of the Act, 21 U.S.C. section 360bbb-3(b)(1), unless the authorization is terminated or revoked.  Performed at [facility], [address] [place], [place] [address]          Radiology Studies: CT Head Wo Contrast  Result Date: 04/14/2021 CLINICAL DATA:  Trauma EXAM: CT HEAD WITHOUT CONTRAST TECHNIQUE: Contiguous axial images were obtained from the base of the skull through the vertex without intravenous contrast. COMPARISON:  None. FINDINGS: Brain: Chronic white matter ischemic change. No evidence of acute infarction, hemorrhage, hydrocephalus, extra-axial collection or mass lesion/mass effect. Vascular: No hyperdense vessel or unexpected calcification. Skull: Normal. Negative for fracture or focal lesion. Sinuses/Orbits: Partial opacification of the right sphenoid sinus. No acute finding. Other: None. IMPRESSION: No acute intracranial abnormality. Electronically Signed   By: Yetta Glassman M.D.   On: 04/14/2021 11:39   CT PELVIS WO  CONTRAST  Result Date: 04/14/2021 CLINICAL DATA:  Fall, evaluate right pubic fractures EXAM: CT PELVIS WITHOUT CONTRAST TECHNIQUE: Multidetector CT imaging of the pelvis was performed following the standard protocol without intravenous contrast. COMPARISON:  Same day radiographs of the right hip FINDINGS: Urinary Tract:  No abnormality visualized. Bowel:  Sigmoid diverticulosis. Vascular/Lymphatic: No pathologically enlarged lymph nodes. Aortic atherosclerosis. Reproductive:  No mass or other significant abnormality Other:  None. Musculoskeletal: No suspicious bone lesions  identified. Osteopenia. Comminuted fractures of the right superior and inferior pubic rami about the pubic symphysis (series 2, image 81) with additional minimally displaced fracture of the mid right inferior pubic ramus (series 2, image 98). Additional minimally displaced fractures of the right sacral ala (series 2, image 37). IMPRESSION: 1. Comminuted fractures of the right superior and inferior pubic rami about the pubic symphysis with additional minimally displaced fracture of the mid right inferior pubic ramus, as seen on prior radiographs. No involvement of the acetabulum. 2. Additional minimally displaced fractures of the right sacral ala. Aortic Atherosclerosis (ICD10-I70.0). Electronically Signed   By: Delanna Ahmadi M.D.   On: 04/14/2021 13:20   DG Hip Unilat  With Pelvis 2-3 Views Right  Result Date: 04/14/2021 CLINICAL DATA:  Fall, hip pain EXAM: DG HIP (WITH OR WITHOUT PELVIS) 2-3V RIGHT COMPARISON:  None. FINDINGS: Acute comminuted fractures of the right parasymphyseal pubic bone and superior and inferior pubic rami, with minimal displacement of fracture fragments. No acute fracture of the proximal right femur. No hip dislocation. IMPRESSION: Acute fractures of the right pubic bone. Electronically Signed   By: Ofilia Neas M.D.   On: 04/14/2021 12:01        Scheduled Meds:  ascorbic acid  1,000 mg Oral Daily   chlorthalidone  25 mg Oral Daily   cholecalciferol  4,000 Units Oral Daily   dorzolamide  1 drop Both Eyes Q12H   enoxaparin (LOVENOX) injection  40 mg Subcutaneous Q24H   latanoprost  1 drop Both Eyes QHS   letrozole  2.5 mg Oral Daily   magnesium oxide  400 mg Oral Daily   rosuvastatin  20 mg Oral QHS   timolol  1 drop Both Eyes BID   Continuous Infusions:  methocarbamol (ROBAXIN) IV       LOS: 2 days    Time spent: 30 mins    Wyvonnia Dusky, MD [facility] Pager [phone]  If 7PM-7AM, please contact night-coverage 04/16/2021, 7:42 AM

## 2021-04-16 NOTE — TOC Progression Note (Addendum)
Transition of Care [facility]) - Progression Note    Patient Details  Name: Suzanne Nunez MRN: [id number] Date of Birth: [birth year]  Transition of [facility]) CM/SW [place], RN Phone Number: 04/16/2021, 1:04 PM  Clinical Narrative:   As of today, the only SNF bed offer is from [facility],  SNF bed search re-sent today.  Message left for Granddaughter.  TOC to follow to discharge.  Addendum:  Spoke with granddaughter, who prefers [facility] as first choice and has toured the facility, or [facility], which is 2nd because she has not toured the facility yet.  Messages left for both facilities and re-sent clinical information on portal.  TOC to follow to discharge.  Expected Discharge Plan: [place] Barriers to Discharge: Continued Medical Work up  Expected Discharge Plan and Services Expected Discharge Plan: [place] arrangements for the past 2 months: Single Family Home                                       Social Determinants of Health (SDOH) Interventions    Readmission Risk Interventions No flowsheet data found.

## 2021-04-16 NOTE — Progress Notes (Signed)
Physical Therapy Treatment Patient Details Name: Suzanne Nunez MRN: [id number] DOB: 1932/04/19 Today's Date: 04/16/2021   History of Present Illness Pt admitted to [facility] on 04/14/21 for mechanical fall that resulted in R superior and inferior pubic rami fracture. Pt not an operative candidate; to proceed with WBAT. Significant PMH includes: dementia and HTN.    PT Comments    Coordinated pain meds with nursing for session.  Participated in exercises as described below.  To EOB with good initiation by pt and wanting to try but needs mod a x 1 to complete task. Steady once sitting.  She is able to stand to RW with min a x 1 but unable to take any steps.  Pt inc urine in standing and is assisted to [facility] to void then to recliner.  She needs varied assist levels with tranfers from min to max a x 1 to complete depending on task but is motivated to do on her own.  Remains in chair with grand daughter in room.   Recommendations for follow up therapy are one component of a multi-disciplinary discharge planning process, led by the attending physician.  Recommendations may be updated based on patient status, additional functional criteria and insurance authorization.  Follow Up Recommendations  Skilled nursing-short term rehab (<3 hours/day)     Assistance Recommended at Discharge    Equipment Recommendations       Recommendations for Other Services       Precautions / Restrictions Precautions Precautions: Fall Restrictions Weight Bearing Restrictions: Yes RLE Weight Bearing: Weight bearing as tolerated     Mobility  Bed Mobility Overal bed mobility: Needs Assistance Bed Mobility: Supine to Sit     Supine to sit: Mod assist     General bed mobility comments: does try on her own today but needs help to complete    Transfers Overall transfer level: Needs assistance Equipment used: Rolling walker (2 wheels);None Transfers: Sit to/from Stand;Bed to chair/wheelchair/BSC Sit to Stand:  Min assist Stand pivot transfers: Mod assist         General transfer comment: pt very motivated to try on her own today and does well when given time but remains limited by pain and needs mod/max a to complete stand pivot transfer    Ambulation/Gait                   Stairs             Wheelchair Mobility    Modified Rankin (Stroke Patients Only)       Balance Overall balance assessment: Needs assistance Sitting-balance support: Feet supported Sitting balance-Leahy Scale: Fair     Standing balance support: Bilateral upper extremity supported;During functional activity Standing balance-Leahy Scale: Poor                              Cognition Arousal/Alertness: Awake/alert Behavior During Therapy: WFL for tasks assessed/performed Overall Cognitive Status: History of cognitive impairments - at baseline                                          Exercises Other Exercises Other Exercises: supine AA/PROM x 10 RLE    General Comments        Pertinent Vitals/Pain Pain Assessment: Faces Faces Pain Scale: Hurts even more Pain Location: RLE during WB Pain Descriptors / Indicators: Aching;Grimacing;Sore  Pain Intervention(s): Limited activity within patient's tolerance;Monitored during session;Premedicated before session;Repositioned    Home Living                          Prior Function            PT Goals (current goals can now be found in the care plan section) Progress towards PT goals: Progressing toward goals    Frequency    7X/week      PT Plan Current plan remains appropriate    Co-evaluation              AM-PAC PT "6 Clicks" Mobility   Outcome Measure  Help needed turning from your back to your side while in a flat bed without using bedrails?: A Lot Help needed moving from lying on your back to sitting on the side of a flat bed without using bedrails?: A Lot Help needed moving to and  from a bed to a chair (including a wheelchair)?: A Lot Help needed standing up from a chair using your arms (e.g., wheelchair or bedside chair)?: A Lot Help needed to walk in hospital room?: Total Help needed climbing 3-5 steps with a railing? : Total 6 Click Score: 10    End of Session Equipment Utilized During Treatment: Gait belt Activity Tolerance: Patient limited by pain Patient left: in chair;with call bell/phone within reach;with chair alarm set;with family/visitor present Nurse Communication: Mobility status;Patient requests pain meds PT Visit Diagnosis: Unsteadiness on feet (R26.81);Repeated falls (R29.6);Muscle weakness (generalized) (M62.81);Difficulty in walking, not elsewhere classified (R26.2);Pain Pain - Right/Left: Right Pain - part of body: Hip     Time: 1173-5670 PT Time Calculation (min) (ACUTE ONLY): 30 min  Charges:  $Therapeutic Exercise: 8-22 mins $Therapeutic Activity: 8-22 mins                    Chesley Noon, PTA 04/16/21, 10:06 AM

## 2021-04-16 NOTE — Plan of Care (Signed)

## 2021-04-17 DIAGNOSIS — I10 Essential (primary) hypertension: Secondary | ICD-10-CM | POA: Diagnosis not present

## 2021-04-17 DIAGNOSIS — R001 Bradycardia, unspecified: Secondary | ICD-10-CM

## 2021-04-17 DIAGNOSIS — S72001A Fracture of unspecified part of neck of right femur, initial encounter for closed fracture: Secondary | ICD-10-CM | POA: Diagnosis not present

## 2021-04-17 DIAGNOSIS — S3282XA Multiple fractures of pelvis without disruption of pelvic ring, initial encounter for closed fracture: Secondary | ICD-10-CM | POA: Diagnosis not present

## 2021-04-17 DIAGNOSIS — S72009A Fracture of unspecified part of neck of unspecified femur, initial encounter for closed fracture: Secondary | ICD-10-CM | POA: Diagnosis not present

## 2021-04-17 LAB — CBC
HCT: 39.4 % (ref 36.0–46.0)
Hemoglobin: 13.2 g/dL (ref 12.0–15.0)
MCH: 32 pg (ref 26.0–34.0)
MCHC: 33.5 g/dL (ref 30.0–36.0)
MCV: 95.6 fL (ref 80.0–100.0)
Platelets: 292 10*3/uL (ref 150–400)
RBC: 4.12 MIL/uL (ref 3.87–5.11)
RDW: 13.2 % (ref 11.5–15.5)
WBC: 7.4 10*3/uL (ref 4.0–10.5)
nRBC: 0 % (ref 0.0–0.2)

## 2021-04-17 LAB — BASIC METABOLIC PANEL
Anion gap: 6 (ref 5–15)
BUN: 19 mg/dL (ref 8–23)
CO2: 31 mmol/L (ref 22–32)
Calcium: 9.5 mg/dL (ref 8.9–10.3)
Chloride: 96 mmol/L — ABNORMAL LOW (ref 98–111)
Creatinine, Ser: 0.87 mg/dL (ref 0.44–1.00)
GFR, Estimated: >60 mL/min (ref >60)
Glucose, Bld: 116 mg/dL — ABNORMAL HIGH (ref 70–99)
Potassium: 3.9 mmol/L (ref 3.5–5.1)
Sodium: 133 mmol/L — ABNORMAL LOW (ref 135–145)

## 2021-04-17 MED ORDER — HYDRALAZINE HCL 20 MG/ML IJ SOLN
10.0000 mg | Freq: Three times a day (TID) | INTRAMUSCULAR | Status: DC | PRN
  Administered 2021-04-17 – 2021-04-18 (×2): 10 mg via INTRAVENOUS
  Filled 2021-04-17 (×2): qty 1

## 2021-04-17 MED ORDER — ONDANSETRON HCL 4 MG/2ML IJ SOLN
4.0000 mg | Freq: Four times a day (QID) | INTRAMUSCULAR | Status: DC | PRN
  Administered 2021-04-18: 4 mg via INTRAVENOUS
  Filled 2021-04-17 (×2): qty 2

## 2021-04-17 MED ORDER — SODIUM CHLORIDE 1 G PO TABS
1.0000 g | ORAL_TABLET | Freq: Every day | ORAL | Status: DC
  Filled 2021-04-17: qty 1

## 2021-04-17 MED ORDER — TRAMADOL HCL 50 MG PO TABS
50.0000 mg | ORAL_TABLET | Freq: Four times a day (QID) | ORAL | Status: DC | PRN
  Administered 2021-04-17 – 2021-04-19 (×4): 50 mg via ORAL
  Filled 2021-04-17 (×4): qty 1

## 2021-04-17 NOTE — Progress Notes (Signed)
Physical Therapy Treatment Patient Details Name: Suzanne Nunez MRN: [id number] DOB: 1931-11-07 Today's Date: 04/17/2021   History of Present Illness Pt admitted to [facility] on 04/14/21 for mechanical fall that resulted in R superior and inferior pubic rami fracture. Pt not an operative candidate; to proceed with WBAT. Significant PMH includes: dementia and HTN.    PT Comments    Pain meds coordinated for session.  To EOB with mod a x 1.  Stood to transfer to commode with stand pivot and increased time.  Pt likes to try it own her own but is generally unsuccessful needing verbal and tactile cues.  She is able to void and perform her own self care.  Stands to transfer to recliner but general pain and fear are barriers and +2 needed to transfer to recliner at bedside.  Cognition and pain remain primary barriers for mobility but pain is as expected for diagnosis.   Recommendations for follow up therapy are one component of a multi-disciplinary discharge planning process, led by the attending physician.  Recommendations may be updated based on patient status, additional functional criteria and insurance authorization.  Follow Up Recommendations  Skilled nursing-short term rehab (<3 hours/day)     Assistance Recommended at Discharge Intermittent Supervision/Assistance  Equipment Recommendations  BSC/3in1    Recommendations for Other Services       Precautions / Restrictions Precautions Precautions: Fall Restrictions Weight Bearing Restrictions: Yes RLE Weight Bearing: Weight bearing as tolerated     Mobility  Bed Mobility Overal bed mobility: Needs Assistance Bed Mobility: Supine to Sit     Supine to sit: Mod assist          Transfers Overall transfer level: Needs assistance Equipment used: None Transfers: Sit to/from Stand   Stand pivot transfers: Mod assist;+2 physical assistance         General transfer comment: able to transfer to commode with mod a +1 to void but  requries mod a +2 to recliner due to pain and fear.    Ambulation/Gait                   Stairs             Wheelchair Mobility    Modified Rankin (Stroke Patients Only)       Balance Overall balance assessment: Needs assistance Sitting-balance support: Feet supported Sitting balance-Leahy Scale: Fair     Standing balance support: Bilateral upper extremity supported;During functional activity Standing balance-Leahy Scale: Poor                              Cognition Arousal/Alertness: Awake/alert Behavior During Therapy: WFL for tasks assessed/performed Overall Cognitive Status: History of cognitive impairments - at baseline                                          Exercises Other Exercises Other Exercises: supine AA/PROM x 10 RLE    General Comments        Pertinent Vitals/Pain Pain Assessment: Faces Faces Pain Scale: Hurts whole lot Pain Descriptors / Indicators: Aching;Grimacing;Sore Pain Intervention(s): Limited activity within patient's tolerance;Monitored during session;Premedicated before session;Repositioned    Home Living                          Prior Function  PT Goals (current goals can now be found in the care plan section) Progress towards PT goals: Progressing toward goals    Frequency    7X/week      PT Plan Current plan remains appropriate    Co-evaluation              AM-PAC PT "6 Clicks" Mobility   Outcome Measure  Help needed turning from your back to your side while in a flat bed without using bedrails?: A Lot Help needed moving from lying on your back to sitting on the side of a flat bed without using bedrails?: A Lot Help needed moving to and from a bed to a chair (including a wheelchair)?: A Lot Help needed standing up from a chair using your arms (e.g., wheelchair or bedside chair)?: A Lot Help needed to walk in hospital room?: Total Help needed  climbing 3-5 steps with a railing? : Total 6 Click Score: 10    End of Session Equipment Utilized During Treatment: Gait belt Activity Tolerance: Patient limited by pain Patient left: in chair;with call bell/phone within reach;with chair alarm set;with family/visitor present Nurse Communication: Mobility status;Patient requests pain meds PT Visit Diagnosis: Unsteadiness on feet (R26.81);Repeated falls (R29.6);Muscle weakness (generalized) (M62.81);Difficulty in walking, not elsewhere classified (R26.2);Pain Pain - Right/Left: Right Pain - part of body: Hip     Time: 0120-0138 PT Time Calculation (min) (ACUTE ONLY): 18 min  Charges:  $Therapeutic Activity: 8-22 mins                    Chesley Noon, PTA 04/17/21, 2:34 PM

## 2021-04-17 NOTE — Consult Note (Signed)
[facility] Cardiology Consultation Note  Patient ID: Suzanne Nunez, MRN: [id number], DOB/AGE: Jun 12, 1931 87 y.o. Admit date: 04/14/2021   Date of Consult: 04/17/2021 Primary Physician: Merryl Hacker No Primary Cardiologist: Jennette Dubin, [facility]  Chief Complaint:  Chief Complaint  Patient presents with  . Fall  . Hip Pain   Reason for Consult:  Bradycardia  HPI: 87 y.o. female with a past medical history of dementia and hypertension who presented to [facility] on 12/2 via EMS after an unwitnessed fall.  Patient lives at home with her granddaughter.  Imaging upon admission revealed right superior and inferior pubic rami and sacral fractures.  She was seen by orthopedics and the plan is conservative treatment with physical therapy and weightbearing as tolerated.  Patient is accompanied by a female caretaker at bedside and granddaughter present via FaceTime.  Cardiology was consulted for bradycardia noted on the blood pressure machine to be 38 bpm 1 time earlier this morning.  Patient states she is in pain in her right hip but denies pain in her chest.  At the time of interview patient is not on the telemetry machine but patient's EKG today revealed normal sinus rhythm with rate of 70s and frequent PVCs, left atrial enlargement and incomplete right bundle branch block unchanged from her last EKG. Patient does not appear to be on any medications that would slow her heart rate.  Per nurse at bedside patient vomited after taking her medications this morning and has not been able to take her blood pressure meds yet today.    History reviewed. No pertinent past medical history.    Surgical History: History reviewed. No pertinent surgical history.   Home Meds: Prior to Admission medications   Medication Sig Start Date End Date Taking? Authorizing Provider  acetaminophen (TYLENOL) 500 MG tablet Take 500-1,000 mg by mouth every 6 (six) hours as needed for mild pain or moderate pain.   Yes  [provider]  ascorbic acid (VITAMIN C) 1000 MG tablet Take 1,000 mg by mouth daily.   Yes [provider]  chlorthalidone (HYGROTON) 25 MG tablet Take 25 mg by mouth daily.   Yes [provider]  Cholecalciferol (SM VITAMIN D3) 100 MCG (4000 UT) CAPS Take 4,000 Units by mouth daily.   Yes [provider]  dorzolamide-timolol (COSOPT) 22.3-6.8 MG/ML ophthalmic solution Place 1 drop into both eyes 2 times daily at 12 noon and 4 pm.   Yes [provider]  Glucosamine-Chondroit-Vit C-Mn (GLUCOSAMINE CHONDR 1500 COMPLX) CAPS Take 2 capsules by mouth daily.   Yes [provider]  latanoprost (XALATAN) 0.005 % ophthalmic solution Place 1 drop into both eyes at bedtime.   Yes [provider]  letrozole (FEMARA) 2.5 MG tablet Take 2.5 mg by mouth daily.   Yes [provider]  magnesium oxide (MAG-OX) 400 MG tablet Take 400 mg by mouth daily.   Yes [provider]  rosuvastatin (CRESTOR) 20 MG tablet Take 20 mg by mouth at bedtime.   Yes [provider]  senna-docusate (SENOKOT-S) 8.6-50 MG tablet Take 1 tablet by mouth daily.   Yes [provider]  zinc sulfate 220 (50 Zn) MG capsule Take 220 mg by mouth at bedtime.   Yes [provider]    Inpatient Medications:  . ascorbic acid  1,000 mg Oral Daily  . cholecalciferol  4,000 Units Oral Daily  . dorzolamide  1 drop Both Eyes Q12H  . enoxaparin (LOVENOX) injection  40 mg Subcutaneous Q24H  .  latanoprost  1 drop Both Eyes QHS  . letrozole  2.5 mg Oral Daily  . magnesium oxide  400 mg Oral Daily  . rosuvastatin  20 mg Oral QHS  . [START ON 04/18/2021] sodium chloride  1 g Oral Daily   . methocarbamol (ROBAXIN) IV      Allergies: No Known Allergies  Social History   Socioeconomic History  . Marital status: Unknown    Spouse name: Not on file  . Number of children: Not on file  . Years of education: Not on file  . Highest education  level: Not on file  Occupational History  . Not on file  Tobacco Use  . Smoking status: Never  . Smokeless tobacco: Never  Substance and Sexual Activity  . Alcohol use: Not on file  . Drug use: Not on file  . Sexual activity: Not on file  Other Topics Concern  . Not on file  Social History Narrative  . Not on file   Social Determinants of Health   Financial Resource Strain: Not on file  Food Insecurity: Not on file  Transportation Needs: Not on file  Physical Activity: Not on file  Stress: Not on file  Social Connections: Not on file  Intimate Partner Violence: Not on file     History reviewed. No pertinent family history.   Review of Systems Positive for right hip pain  Negative for: General:  chills, fever, night sweats or weight changes.  Cardiovascular: PND orthopnea syncope dizziness  Dermatological skin lesions rashes Respiratory: Cough congestion Urologic: Frequent urination urination at night and hematuria Abdominal: negative for nausea, vomiting, diarrhea, bright red blood per rectum, melena, or hematemesis Neurologic: negative for visual changes, and/or hearing changes  All other systems reviewed and are otherwise negative except as noted above.  Labs: No results for input(s): CKTOTAL, CKMB, TROPONINI in the last 72 hours. Lab Results  Component Value Date   WBC 7.4 04/17/2021   HGB 13.2 04/17/2021   HCT 39.4 04/17/2021   MCV 95.6 04/17/2021   PLT 292 04/17/2021    Recent Labs  Lab 04/17/21 0443  NA 133*  K 3.9  CL 96*  CO2 31  BUN 19  CREATININE 0.87  CALCIUM 9.5  GLUCOSE 116*   No results found for: CHOL, HDL, LDLCALC, TRIG No results found for: DDIMER  Radiology/Studies:  CT Head Wo Contrast  Result Date: 04/14/2021 CLINICAL DATA:  Trauma EXAM: CT HEAD WITHOUT CONTRAST TECHNIQUE: Contiguous axial images were obtained from the base of the skull through the vertex without intravenous contrast. COMPARISON:  None. FINDINGS: Brain: Chronic  white matter ischemic change. No evidence of acute infarction, hemorrhage, hydrocephalus, extra-axial collection or mass lesion/mass effect. Vascular: No hyperdense vessel or unexpected calcification. Skull: Normal. Negative for fracture or focal lesion. Sinuses/Orbits: Partial opacification of the right sphenoid sinus. No acute finding. Other: None. IMPRESSION: No acute intracranial abnormality. Electronically Signed   By: Yetta Glassman M.D.   On: 04/14/2021 11:39   CT PELVIS WO CONTRAST  Result Date: 04/14/2021 CLINICAL DATA:  Fall, evaluate right pubic fractures EXAM: CT PELVIS WITHOUT CONTRAST TECHNIQUE: Multidetector CT imaging of the pelvis was performed following the standard protocol without intravenous contrast. COMPARISON:  Same day radiographs of the right hip FINDINGS: Urinary Tract:  No abnormality visualized. Bowel:  Sigmoid diverticulosis. Vascular/Lymphatic: No pathologically enlarged lymph nodes. Aortic atherosclerosis. Reproductive:  No mass or other significant abnormality Other:  None. Musculoskeletal: No suspicious bone lesions identified. Osteopenia. Comminuted fractures of the right  superior and inferior pubic rami about the pubic symphysis (series 2, image 81) with additional minimally displaced fracture of the mid right inferior pubic ramus (series 2, image 98). Additional minimally displaced fractures of the right sacral ala (series 2, image 37). IMPRESSION: 1. Comminuted fractures of the right superior and inferior pubic rami about the pubic symphysis with additional minimally displaced fracture of the mid right inferior pubic ramus, as seen on prior radiographs. No involvement of the acetabulum. 2. Additional minimally displaced fractures of the right sacral ala. Aortic Atherosclerosis (ICD10-I70.0). Electronically Signed   By: Delanna Ahmadi M.D.   On: 04/14/2021 13:20   DG Hip Unilat  With Pelvis 2-3 Views Right  Result Date: 04/14/2021 CLINICAL DATA:  Fall, hip pain EXAM: DG  HIP (WITH OR WITHOUT PELVIS) 2-3V RIGHT COMPARISON:  None. FINDINGS: Acute comminuted fractures of the right parasymphyseal pubic bone and superior and inferior pubic rami, with minimal displacement of fracture fragments. No acute fracture of the proximal right femur. No hip dislocation. IMPRESSION: Acute fractures of the right pubic bone. Electronically Signed   By: Ofilia Neas M.D.   On: 04/14/2021 12:01    EKG: Normal sinus rhythm with ventricular rate of 70, frequent PVCs, left atrial enlargement and incomplete right bundle branch block  Weights: Filed Weights   04/14/21 1015  Weight: 59 kg     Physical Exam: Blood pressure (!) 139/59, pulse 71, temperature (!) 97.4 F (36.3 C), resp. rate 18, height 5\' 3"  (1.6 m), weight 59 kg, SpO2 98 %. Body mass index is 23.03 kg/m. General: Well developed, well nourished, in no acute distress. Head eyes ears nose throat: Normocephalic, atraumatic, sclera non-icteric, no xanthomas, nares are without discharge. No apparent thyromegaly and/or mass  Lungs: Normal respiratory effort.  no wheezes, no rales, no rhonchi.  Heart: Irregular rate and regular rhythm with normal S1 S2. no murmur gallop, no rub, PMI is normal size and placement, carotid upstroke normal without bruit, jugular venous pressure is normal Abdomen: Soft, non-tender, non-distended with normoactive bowel sounds. No hepatomegaly. No rebound/guarding. No obvious abdominal masses. Abdominal aorta is normal size without bruit Extremities: No edema. no cyanosis, no clubbing, no ulcers  Peripheral : 2+ bilateral upper extremity pulses, 2+ bilateral femoral pulses, 2+ bilateral dorsal pedal pulse Neuro: Alert and oriented. No facial asymmetry. No focal deficit. Moves all extremities spontaneously. Musculoskeletal: Normal muscle tone without kyphosis Psych:  Responds to questions appropriately with a normal affect.    Assessment: 87 year old female with a past medical history of  dementia and hypertension who presented to [facility] on 12/2 via EMS after an unwitnessed fall.  Cardiology consulted for 1 instance of bradycardia (HR 38) as recorded on blood pressure monitor.  Plan: #Frequent PVCs -Patient's most recent EKG revealed normal sinus rhythm with ventricular rate in the 70s and frequent PVCs.  Patient's irregular heartbeat felt by manual pulse check or with the blood pressure monitor is likely not capturing the PVC beats -Recommend patient be put on telemetry during her hospitalization to evaluate for true bradycardia if further concerns tomorrow -Patient can follow-up with cardiology on an outpatient basis and wear Holter monitor for further evaluation if needed  #Hypertension -Continue current medications  Signed, Corey Skains M.D. [facility] 04/17/2021, 3:47 PM

## 2021-04-17 NOTE — Progress Notes (Signed)
OT Cancellation Note  Patient Details Name: Suzanne Nunez MRN: [id number] DOB: Jul 11, 1931   Cancelled Treatment:    Reason Eval/Treat Not Completed: Fatigue/lethargy limiting ability to participate  OT attempted to engage pt in treatment session, but pt currently asleep.  Pt's granddaughter in room and requested pt continue resting as she had recently finished physical therapy session.  Will continue to follow up.  Jeneen Montgomery, OTR/L 04/17/21, 3:48 PM

## 2021-04-17 NOTE — TOC Progression Note (Signed)
Transition of Care [facility]) - Progression Note    Patient Details  Name: Manessa Buley MRN: [id number] Date of Birth: [birth year]  Transition of Care [facility]) CM/SW [place], RN Phone Number: 04/17/2021, 10:46 AM  Clinical Narrative:   [facility] [employer] has offered a bed, patient's granddaughter accepted offer.  Patient will need COVID test and insurance auth.  Started auth.  TOC contact information provided.  TOC to follow to discharge.    Expected Discharge Plan: [place] Barriers to Discharge: Continued Medical Work up  Expected Discharge Plan and Services Expected Discharge Plan: [place] arrangements for the past 2 months: Single Family Home                                       Social Determinants of Health (SDOH) Interventions    Readmission Risk Interventions No flowsheet data found.

## 2021-04-17 NOTE — Care Management Important Message (Signed)
Important Message  Patient Details  Name: Suzanne Nunez MRN: [id number] Date of Birth: 13-Jun-1931   Medicare Important Message Given:  Yes  RNCM has been working on the discharge plan with the patient's granddaughter, Suzanne Nunez [phone] and I reviewed the Important Message from Medicare with her. She is in agreement with the discharge plan and waiting on insurance authorization.  I thanked her for her time.   Juliann Pulse A Laketa Sandoz 04/17/2021, 3:02 PM

## 2021-04-17 NOTE — Progress Notes (Signed)
PROGRESS NOTE    Suzanne Nunez  HTD:[id number] DOB: 1931/07/01 DOA: 04/14/2021 PCP: Pcp, No  Assessment & Plan:   Principal Problem:   Hip fracture ([place])   Pelvic fractures: of right superior & inferior pubic rami as per CT scan.   Secondary to fall at home. Not operative candidate. Weightbearing as tolerated. PT/OT recs SNF   Bradycardia: not on any oral BB or CCB but was taking timolol eye drops. Will hold home dose of timolol. Continue on tele. Cardio consulted   Hypokalemia: WNL today   Hyponatremia: trending up today. Continue on NaCl. Will continue to monitor   HTN: will hold chlorthalidone. IV hydralazine prn  Glaucoma: continue on home dose of dorzolamide, latanoprost. Hold home dose of timolol secondary to bradycardia  HLD: continue on statin    DVT prophylaxis: lovenox  Code Status: full Family Communication: discussed pt's care w/ pt's family at bedside and answered their questions  Disposition Plan: PT/OT recs SNF   Level of care: Med-Surg  Status is: Inpatient  Remains inpatient appropriate because: severity of illness, waiting on SNF placement & pt is bradycardic. Cardio consulted     Consultants:  Ortho surg Cardio   Procedures:   Antimicrobials:   Subjective: Pt c/o pelvic pain w/ moving   Objective: Vitals:   04/16/21 2002 04/16/21 2340 04/17/21 0503 04/17/21 0702  BP: (!) 160/61 (!) 148/78 (!) 155/45   Pulse: 72 64 63   Resp: 16 14 18    Temp: 97.8 F (36.6 C) 98 F (36.7 C) (!) 97.3 F (36.3 C) 97.7 F (36.5 C)  TempSrc:  Oral  Oral  SpO2: 97% 95% 97%   Weight:      Height:        Intake/Output Summary (Last 24 hours) at 04/17/2021 0743 Last data filed at 04/17/2021 0502 Gross per 24 hour  Intake 720 ml  Output 750 ml  Net -30 ml   Filed Weights   04/14/21 1015  Weight: 59 kg    Examination:  General exam: Appears calm & comfortable  Respiratory system: clear breath sounds b/l  Cardiovascular system: S1 & S2+. No  rubs or clicks  Gastrointestinal system: Abd is soft, NT, ND & hypoactive bowel sounds  Central nervous system: Alert and awake. Moves all extremities  Psychiatry: judgement and insight appear poor. Flat mood and affect    Data Reviewed: I have personally reviewed following labs and imaging studies  CBC: Recent Labs  Lab 04/14/21 1440 04/15/21 0806 04/16/21 0628 04/17/21 0443  WBC 14.8* 8.7 9.4 7.4  NEUTROABS 12.6*  --   --   --   HGB 13.7 12.5 13.0 13.2  HCT 40.5 37.2 38.3 39.4  MCV 92.3 93.2 93.9 95.6  PLT 309 278 283 726   Basic Metabolic Panel: Recent Labs  Lab 04/14/21 1440 04/15/21 0806 04/16/21 0628 04/17/21 0443  NA 130* 128* 127* 133*  K 3.2* 3.6 3.1* 3.9  CL 92* 90* 90* 96*  CO2 29 31 30 31   GLUCOSE 137* 117* 125* 116*  BUN 21 26* 17 19  CREATININE 0.70 1.00 0.81 0.87  CALCIUM 9.4 8.8* 8.9 9.5   GFR: Estimated Creatinine Clearance: 37.7 mL/min (by C-G formula based on SCr of 0.87 mg/dL). Liver Function Tests: No results for input(s): AST, ALT, ALKPHOS, BILITOT, PROT, ALBUMIN in the last 168 hours. No results for input(s): LIPASE, AMYLASE in the last 168 hours. No results for input(s): AMMONIA in the last 168 hours. Coagulation Profile: No results for input(s):  INR, PROTIME in the last 168 hours. Cardiac Enzymes: No results for input(s): CKTOTAL, CKMB, CKMBINDEX, TROPONINI in the last 168 hours. BNP (last 3 results) No results for input(s): PROBNP in the last 8760 hours. HbA1C: No results for input(s): HGBA1C in the last 72 hours. CBG: No results for input(s): GLUCAP in the last 168 hours. Lipid Profile: No results for input(s): CHOL, HDL, LDLCALC, TRIG, CHOLHDL, LDLDIRECT in the last 72 hours. Thyroid Function Tests: No results for input(s): TSH, T4TOTAL, FREET4, T3FREE, THYROIDAB in the last 72 hours. Anemia Panel: No results for input(s): VITAMINB12, FOLATE, FERRITIN, TIBC, IRON, RETICCTPCT in the last 72 hours. Sepsis Labs: No results for  input(s): PROCALCITON, LATICACIDVEN in the last 168 hours.  Recent Results (from the past 240 hour(s))  Resp Panel by RT-PCR (Flu A&B, Covid) Nasopharyngeal Swab     Status: None   Collection Time: 04/14/21  2:56 PM   Specimen: Nasopharyngeal Swab; Nasopharyngeal(NP) swabs in vial transport medium  Result Value Ref Range Status   SARS Coronavirus 2 by RT PCR NEGATIVE NEGATIVE Final    Comment: (NOTE) SARS-CoV-2 target nucleic acids are NOT DETECTED.  The SARS-CoV-2 RNA is generally detectable in upper respiratory specimens during the acute phase of infection. The lowest concentration of SARS-CoV-2 viral copies this assay can detect is 138 copies/mL. A negative result does not preclude SARS-Cov-2 infection and should not be used as the sole basis for treatment or other patient management decisions. A negative result may occur with  improper specimen collection/handling, submission of specimen other than nasopharyngeal swab, presence of viral mutation(s) within the areas targeted by this assay, and inadequate number of viral copies(<138 copies/mL). A negative result must be combined with clinical observations, patient history, and epidemiological information. The expected result is Negative.  Fact Sheet for Patients:  EntrepreneurPulse.com.au  Fact Sheet for Healthcare Providers:  IncredibleEmployment.be  This test is no t yet approved or cleared by the [place] FDA and  has been authorized for detection and/or diagnosis of SARS-CoV-2 by FDA under an Emergency Use Authorization (EUA). This EUA will remain  in effect (meaning this test can be used) for the duration of the COVID-19 declaration under Section 564(b)(1) of the Act, 21 U.S.C.section 360bbb-3(b)(1), unless the authorization is terminated  or revoked sooner.       Influenza A by PCR NEGATIVE NEGATIVE Final   Influenza B by PCR NEGATIVE NEGATIVE Final    Comment: (NOTE) The  Xpert Xpress SARS-CoV-2/FLU/RSV plus assay is intended as an aid in the diagnosis of influenza from Nasopharyngeal swab specimens and should not be used as a sole basis for treatment. Nasal washings and aspirates are unacceptable for Xpert Xpress SARS-CoV-2/FLU/RSV testing.  Fact Sheet for Patients: EntrepreneurPulse.com.au  Fact Sheet for Healthcare Providers: IncredibleEmployment.be  This test is not yet approved or cleared by the [place] FDA and has been authorized for detection and/or diagnosis of SARS-CoV-2 by FDA under an Emergency Use Authorization (EUA). This EUA will remain in effect (meaning this test can be used) for the duration of the COVID-19 declaration under Section 564(b)(1) of the Act, 21 U.S.C. section 360bbb-3(b)(1), unless the authorization is terminated or revoked.  Performed at [facility], [address] [place], [place] [address]          Radiology Studies: No results found.      Scheduled Meds:  ascorbic acid  1,000 mg Oral Daily   chlorthalidone  25 mg Oral Daily   cholecalciferol  4,000 Units Oral Daily  dorzolamide  1 drop Both Eyes Q12H   enoxaparin (LOVENOX) injection  40 mg Subcutaneous Q24H   latanoprost  1 drop Both Eyes QHS   letrozole  2.5 mg Oral Daily   magnesium oxide  400 mg Oral Daily   rosuvastatin  20 mg Oral QHS   sodium chloride  1 g Oral BID WC   timolol  1 drop Both Eyes BID   Continuous Infusions:  methocarbamol (ROBAXIN) IV       LOS: 3 days    Time spent: 82 mins    Wyvonnia Dusky, MD [facility] Pager [phone]  If 7PM-7AM, please contact night-coverage 04/17/2021, 7:43 AM

## 2021-04-18 ENCOUNTER — Inpatient Hospital Stay: Payer: Medicare Other

## 2021-04-18 DIAGNOSIS — S72009A Fracture of unspecified part of neck of unspecified femur, initial encounter for closed fracture: Secondary | ICD-10-CM | POA: Diagnosis not present

## 2021-04-18 DIAGNOSIS — F0390 Unspecified dementia without behavioral disturbance: Secondary | ICD-10-CM

## 2021-04-18 DIAGNOSIS — S3282XA Multiple fractures of pelvis without disruption of pelvic ring, initial encounter for closed fracture: Secondary | ICD-10-CM | POA: Diagnosis not present

## 2021-04-18 DIAGNOSIS — E871 Hypo-osmolality and hyponatremia: Secondary | ICD-10-CM

## 2021-04-18 DIAGNOSIS — S72001A Fracture of unspecified part of neck of right femur, initial encounter for closed fracture: Secondary | ICD-10-CM | POA: Diagnosis not present

## 2021-04-18 LAB — BASIC METABOLIC PANEL
Anion gap: 8 (ref 5–15)
BUN: 27 mg/dL — ABNORMAL HIGH (ref 8–23)
CO2: 30 mmol/L (ref 22–32)
Calcium: 9.4 mg/dL (ref 8.9–10.3)
Chloride: 93 mmol/L — ABNORMAL LOW (ref 98–111)
Creatinine, Ser: 1.04 mg/dL — ABNORMAL HIGH (ref 0.44–1.00)
GFR, Estimated: 52 mL/min — ABNORMAL LOW (ref >60)
Glucose, Bld: 127 mg/dL — ABNORMAL HIGH (ref 70–99)
Potassium: 4.1 mmol/L (ref 3.5–5.1)
Sodium: 131 mmol/L — ABNORMAL LOW (ref 135–145)

## 2021-04-18 LAB — CBC
HCT: 37.8 % (ref 36.0–46.0)
Hemoglobin: 12.4 g/dL (ref 12.0–15.0)
MCH: 31.4 pg (ref 26.0–34.0)
MCHC: 32.8 g/dL (ref 30.0–36.0)
MCV: 95.7 fL (ref 80.0–100.0)
Platelets: 321 10*3/uL (ref 150–400)
RBC: 3.95 MIL/uL (ref 3.87–5.11)
RDW: 13.3 % (ref 11.5–15.5)
WBC: 10.2 10*3/uL (ref 4.0–10.5)
nRBC: 0 % (ref 0.0–0.2)

## 2021-04-18 LAB — RESP PANEL BY RT-PCR (FLU A&B, COVID) ARPGX2
Influenza A by PCR: NEGATIVE
Influenza B by PCR: NEGATIVE
SARS Coronavirus 2 by RT PCR: NEGATIVE

## 2021-04-18 MED ORDER — SODIUM CHLORIDE 1 G PO TABS
1.0000 g | ORAL_TABLET | Freq: Two times a day (BID) | ORAL | Status: DC
  Administered 2021-04-18 – 2021-04-19 (×3): 1 g via ORAL
  Filled 2021-04-18 (×4): qty 1

## 2021-04-18 MED ORDER — SODIUM CHLORIDE 0.9 % IV SOLN
6.2500 mg | Freq: Four times a day (QID) | INTRAVENOUS | Status: DC | PRN
  Filled 2021-04-18 (×2): qty 0.25

## 2021-04-18 MED ORDER — DONEPEZIL HCL 5 MG PO TABS: 10.0000 mg | ORAL_TABLET | Freq: Every day | ORAL | Status: DC

## 2021-04-18 NOTE — TOC Progression Note (Signed)
Transition of Care [facility]) - Progression Note    Patient Details  Name: Suzanne Nunez MRN: [id number] Date of Birth: [birth year]  Transition of Care [facility]) CM/SW [place], RN Phone Number: 04/18/2021, 12:18 PM  Clinical Narrative:   Checking the [facility] portal Insurance Josem Kaufmann has not been started, I contacted [facility] and requested insurance to be started, Ref number [id number] Uploaded clinical notes thru the Portal    Expected Discharge Plan: [place] Barriers to Discharge: Continued Medical Work up  Expected Discharge Plan and Services Expected Discharge Plan: [place] arrangements for the past 2 months: Single Family Home                                       Social Determinants of Health (SDOH) Interventions    Readmission Risk Interventions No flowsheet data found.

## 2021-04-18 NOTE — Progress Notes (Addendum)
[facility] Cardiology [facility] Encounter Note  Patient: Suzanne Nunez / Admit Date: 04/14/2021 / Date of Encounter: 04/18/2021, 4:11 PM   Subjective: Patient states she is feeling okay today.  Granddaughter at bedside during interview.  She denies any lightheadedness or any other symptoms.  Review of Systems: Positive for: Right hip pain Negative for: Vision change, hearing change, syncope, dizziness, nausea, vomiting,diarrhea, bloody stool, stomach pain, cough, congestion, diaphoresis, urinary frequency, urinary pain,skin lesions, skin rashes Others previously listed  Objective: Telemetry: Review from 11:27 AM showed sinus bradycardia with junctional escape beats with rate of 38 bpm. 5 beats of V. tach noted.  At 1343 sinus rhythm with first-degree AV block and ventricular rate 72 bpm  Physical Exam: Blood pressure (!) 163/60, pulse 73, temperature 98.2 F (36.8 C), resp. rate 16, height 5\' 3"  (1.6 m), weight 59 kg, SpO2 96 %. Body mass index is 23.03 kg/m. General: Elderly appearing Caucasian female, well nourished, in no acute distress.  Lying comfortably in bed at incline.  Head: Normocephalic, atraumatic, sclera non-icteric, no xanthomas,  Neck: No apparent masses Lungs: Normal respiratory effort with no wheezes, no rhonchi, no rales , no crackles  Heart: Regular rate and rhythm, normal S1 S2, no murmur, no rub, no gallop, Abdomen: Soft, non-tender, non-distended. No apparent hepatosplenomegaly.  Extremities: No edema, no clubbing, no cyanosis, no ulcers,  Peripheral: 2+ radial, 2+ dorsal pedal pulses Neuro: Alert and oriented. Moves all extremities spontaneously. Psych:  Responds to questions appropriately with a normal affect.   Intake/Output Summary (Last 24 hours) at 04/18/2021 1611 Last data filed at 04/18/2021 0524 Gross per 24 hour  Intake 120 ml  Output 475 ml  Net -355 ml    Inpatient Medications:  . ascorbic acid  1,000 mg Oral Daily  . cholecalciferol   4,000 Units Oral Daily  . donepezil  10 mg Oral QHS  . dorzolamide  1 drop Both Eyes Q12H  . enoxaparin (LOVENOX) injection  40 mg Subcutaneous Q24H  . latanoprost  1 drop Both Eyes QHS  . letrozole  2.5 mg Oral Daily  . magnesium oxide  400 mg Oral Daily  . rosuvastatin  20 mg Oral QHS  . sodium chloride  1 g Oral BID WC   Infusions:  . methocarbamol (ROBAXIN) IV    . promethazine (PHENERGAN) injection (IM or IVPB)      Labs: Recent Labs    04/17/21 0443 04/18/21 0419  NA 133* 131*  K 3.9 4.1  CL 96* 93*  CO2 31 30  GLUCOSE 116* 127*  BUN 19 27*  CREATININE 0.87 1.04*  CALCIUM 9.5 9.4   No results for input(s): AST, ALT, ALKPHOS, BILITOT, PROT, ALBUMIN in the last 72 hours. Recent Labs    04/17/21 0443 04/18/21 0419  WBC 7.4 10.2  HGB 13.2 12.4  HCT 39.4 37.8  MCV 95.6 95.7  PLT 292 321   No results for input(s): CKTOTAL, CKMB, TROPONINI in the last 72 hours. Invalid input(s): POCBNP No results for input(s): HGBA1C in the last 72 hours.   Weights: Filed Weights   04/14/21 1015  Weight: 59 kg     Radiology/Studies:  CT Head Wo Contrast  Result Date: 04/14/2021 CLINICAL DATA:  Trauma EXAM: CT HEAD WITHOUT CONTRAST TECHNIQUE: Contiguous axial images were obtained from the base of the skull through the vertex without intravenous contrast. COMPARISON:  None. FINDINGS: Brain: Chronic white matter ischemic change. No evidence of acute infarction, hemorrhage, hydrocephalus, extra-axial collection or mass lesion/mass effect. Vascular:  No hyperdense vessel or unexpected calcification. Skull: Normal. Negative for fracture or focal lesion. Sinuses/Orbits: Partial opacification of the right sphenoid sinus. No acute finding. Other: None. IMPRESSION: No acute intracranial abnormality. Electronically Signed   By: Yetta Glassman M.D.   On: 04/14/2021 11:39   CT PELVIS WO CONTRAST  Result Date: 04/14/2021 CLINICAL DATA:  Fall, evaluate right pubic fractures EXAM: CT PELVIS  WITHOUT CONTRAST TECHNIQUE: Multidetector CT imaging of the pelvis was performed following the standard protocol without intravenous contrast. COMPARISON:  Same day radiographs of the right hip FINDINGS: Urinary Tract:  No abnormality visualized. Bowel:  Sigmoid diverticulosis. Vascular/Lymphatic: No pathologically enlarged lymph nodes. Aortic atherosclerosis. Reproductive:  No mass or other significant abnormality Other:  None. Musculoskeletal: No suspicious bone lesions identified. Osteopenia. Comminuted fractures of the right superior and inferior pubic rami about the pubic symphysis (series 2, image 81) with additional minimally displaced fracture of the mid right inferior pubic ramus (series 2, image 98). Additional minimally displaced fractures of the right sacral ala (series 2, image 37). IMPRESSION: 1. Comminuted fractures of the right superior and inferior pubic rami about the pubic symphysis with additional minimally displaced fracture of the mid right inferior pubic ramus, as seen on prior radiographs. No involvement of the acetabulum. 2. Additional minimally displaced fractures of the right sacral ala. Aortic Atherosclerosis (ICD10-I70.0). Electronically Signed   By: Delanna Ahmadi M.D.   On: 04/14/2021 13:20   DG Abd Portable 1V  Result Date: 04/18/2021 CLINICAL DATA:  Nausea, vomiting. EXAM: PORTABLE ABDOMEN - 1 VIEW COMPARISON:  None. FINDINGS: The bowel gas pattern is normal. No radio-opaque calculi are noted. Mildly displaced fractures are seen involving the right inferior and superior pubic rami of indeterminate age. IMPRESSION: No abnormal bowel dilatation is noted. Right inferior and superior pubic rami fractures are noted of indeterminate age. Electronically Signed   By: Marijo Conception M.D.   On: 04/18/2021 14:36   DG Hip Unilat  With Pelvis 2-3 Views Right  Result Date: 04/14/2021 CLINICAL DATA:  Fall, hip pain EXAM: DG HIP (WITH OR WITHOUT PELVIS) 2-3V RIGHT COMPARISON:  None.  FINDINGS: Acute comminuted fractures of the right parasymphyseal pubic bone and superior and inferior pubic rami, with minimal displacement of fracture fragments. No acute fracture of the proximal right femur. No hip dislocation. IMPRESSION: Acute fractures of the right pubic bone. Electronically Signed   By: Ofilia Neas M.D.   On: 04/14/2021 12:01     Assessment and Recommendation  87 y.o. female with a past medical history of dementia and hypertension who presented to [facility] on 12/2 via EMS after an unwitnessed fall.  Cardiology consulted for bradycardia seen on telemetry 12/6  #Sinus bradycardia with junctional escape beats/sinus rhythm with first-degree AV block -Patient is asymptomatic at the time and denies any dizziness or passing out -Discussed with patient and granddaughter the possibility of pacemaker placement based off holter monitor results but it seems unlikely at this time without current symptoms -It would be important to discontinue QT prolonging agents like Zofran that can lead to episodes of  non  sustained V. tach -recommend discontinuing as needed Robaxin and donepezil as these can have side effects of bradycardia  -30-day Holter monitor applied for further monitoring of patient's heart rate and she can follow up in office for further management  #Hypertension #Hyperlipidemia -Continue as needed hydralazine and Crestor  Signed, Orinda Kenner, PA-C  The patient has been interviewed and examined. I agree with assessment and plan above. Darnell Level  Nehemiah Massed MD [facility]

## 2021-04-18 NOTE — Progress Notes (Signed)
PROGRESS NOTE   HPI was taken from Dr. Dione Plover: Suzanne Nunez is a 87 y.o. female with history of dementia, hypertension, who presents after a fall.   Patient lives with her granddaughter who is at bedside along with patient's daughter.  Patient appeared somewhat confused whenever I tried to obtain history from her, so majority of history obtained from combination of patient and her granddaughter.  They report that she has fallen once or twice before according to her uncle who used to live with the patient.  They have been encouraging her to use walls and doorways to steady herself as well as a cane and a walker, but she does not like it.  Earlier today she had an unwitnessed fall onto her right side, her granddaughter found her and called EMS.  Patient currently denies any numbness or tingling in her lower extremities, does have some pain in her hip.   In the ED vital signs were unremarkable.  Basic labs showed very mild leukocytosis, mild hyponatremia at 130, potassium 3.2, glucose 137, remainder of labs unremarkable.  CT head showed no acute intracranial abnormality.  Plain films of the hip showed acute fracture of the right pubic bone.  Noncon CT pelvis was subsequently obtained which showed comminuted fracture of the right superior and inferior pubic rami without involvement of the acetabulum, as well as a minimally displaced fracture of the right sacral ala.  EKG showed no acute ischemic changes.  ED provider spoke with on-call orthopedist who reviewed case and and stated that she was nonoperative and could be weightbearing as tolerated.  She is admitted for further management.   Hospital course from Dr. Jimmye Norman 12/3-12/6/22: Pt was found to have pelvic fractures after a fall at home. No surgery needed. PT/OT recs SNF. Waiting on SNF placement. Of note, pt intermittently has nausea/vomiting after taking meds. Zofran/phenergan prn.    Tarren Sabree  XVQ:[id number] DOB: Jul 08, 1931 DOA:  04/14/2021 PCP: Pcp, No  Assessment & Plan:   Principal Problem:   Hip fracture ([place])   Pelvic fractures: of right superior & inferior pubic rami as per CT scan.   Secondary to fall at home. Not operative candidate. Weightbearing as tolerated. PT/OT recs SNF   Possible bradycardia: as not found on EKG as per cardio. Not on any oral BB or CCB but was taking timolol eye drops. Will hold home dose of timolol. Continue on tele. Cardio following and recs apprec.   Intermittent nausea/vomiting: occurs after taking meds. XR abd ordered. Zofran/phenergan prn   Hypokalemia: WNL today   Hyponatremia: labile. Continue on NaCl tabs  HTN: continue to hold chlorthalidone secondary to hyponatremia. IV hydralazine prn   Glaucoma: continue on home dose of dorzolamide, latanoprost. Hold home dose of timolol secondary to bradycardia  HLD: continue on statin   Likely dementia: started on aricept as per request of pt's family    DVT prophylaxis: lovenox  Code Status: full Family Communication: discussed pt's care w/ pt's family at bedside and answered their questions  Disposition Plan: PT/OT recs SNF   Level of care: Med-Surg  Status is: Inpatient  Remains inpatient appropriate because: severity of illness, waiting on SNF placement & pt is bradycardic. Cardio consulted     Consultants:  Ortho surg Cardio   Procedures:   Antimicrobials:   Subjective: Pt c/o fatigue   Objective: Vitals:   04/17/21 1528 04/17/21 2025 04/18/21 0057 04/18/21 0521  BP: (!) 139/59 (!) 142/55 (!) 136/54 (!) 159/63  Pulse: 71 78 72 74  Resp: 18 16 18 17   Temp: (!) 97.4 F (36.3 C) 98 F (36.7 C) 98.4 F (36.9 C) 98.3 F (36.8 C)  TempSrc:      SpO2: 98% 96% 99% 95%  Weight:      Height:        Intake/Output Summary (Last 24 hours) at 04/18/2021 0757 Last data filed at 04/18/2021 0524 Gross per 24 hour  Intake 120 ml  Output 475 ml  Net -355 ml   Filed Weights   04/14/21 1015  Weight: 59  kg    Examination:  General exam: Appears comfortable  Respiratory system: clear breath sounds b/l. No rales  Cardiovascular system: S1/S2+. No rubs or clicks  Gastrointestinal system: Abd is soft, NT, ND & hypoactive bowel sounds Central nervous system: Alert and awake. Moves all extremities  Psychiatry: judgement and insight appears poor. Flat mood and affect     Data Reviewed: I have personally reviewed following labs and imaging studies  CBC: Recent Labs  Lab 04/14/21 1440 04/15/21 0806 04/16/21 0628 04/17/21 0443 04/18/21 0419  WBC 14.8* 8.7 9.4 7.4 10.2  NEUTROABS 12.6*  --   --   --   --   HGB 13.7 12.5 13.0 13.2 12.4  HCT 40.5 37.2 38.3 39.4 37.8  MCV 92.3 93.2 93.9 95.6 95.7  PLT 309 278 283 292 409   Basic Metabolic Panel: Recent Labs  Lab 04/14/21 1440 04/15/21 0806 04/16/21 0628 04/17/21 0443 04/18/21 0419  NA 130* 128* 127* 133* 131*  K 3.2* 3.6 3.1* 3.9 4.1  CL 92* 90* 90* 96* 93*  CO2 29 31 30 31 30   GLUCOSE 137* 117* 125* 116* 127*  BUN 21 26* 17 19 27*  CREATININE 0.70 1.00 0.81 0.87 1.04*  CALCIUM 9.4 8.8* 8.9 9.5 9.4   GFR: Estimated Creatinine Clearance: 31.5 mL/min (A) (by C-G formula based on SCr of 1.04 mg/dL (H)). Liver Function Tests: No results for input(s): AST, ALT, ALKPHOS, BILITOT, PROT, ALBUMIN in the last 168 hours. No results for input(s): LIPASE, AMYLASE in the last 168 hours. No results for input(s): AMMONIA in the last 168 hours. Coagulation Profile: No results for input(s): INR, PROTIME in the last 168 hours. Cardiac Enzymes: No results for input(s): CKTOTAL, CKMB, CKMBINDEX, TROPONINI in the last 168 hours. BNP (last 3 results) No results for input(s): PROBNP in the last 8760 hours. HbA1C: No results for input(s): HGBA1C in the last 72 hours. CBG: No results for input(s): GLUCAP in the last 168 hours. Lipid Profile: No results for input(s): CHOL, HDL, LDLCALC, TRIG, CHOLHDL, LDLDIRECT in the last 72 hours. Thyroid  Function Tests: No results for input(s): TSH, T4TOTAL, FREET4, T3FREE, THYROIDAB in the last 72 hours. Anemia Panel: No results for input(s): VITAMINB12, FOLATE, FERRITIN, TIBC, IRON, RETICCTPCT in the last 72 hours. Sepsis Labs: No results for input(s): PROCALCITON, LATICACIDVEN in the last 168 hours.  Recent Results (from the past 240 hour(s))  Resp Panel by RT-PCR (Flu A&B, Covid) Nasopharyngeal Swab     Status: None   Collection Time: 04/14/21  2:56 PM   Specimen: Nasopharyngeal Swab; Nasopharyngeal(NP) swabs in vial transport medium  Result Value Ref Range Status   SARS Coronavirus 2 by RT PCR NEGATIVE NEGATIVE Final    Comment: (NOTE) SARS-CoV-2 target nucleic acids are NOT DETECTED.  The SARS-CoV-2 RNA is generally detectable in upper respiratory specimens during the acute phase of infection. The lowest concentration of SARS-CoV-2 viral copies this assay can detect is 138 copies/mL. A negative  result does not preclude SARS-Cov-2 infection and should not be used as the sole basis for treatment or other patient management decisions. A negative result may occur with  improper specimen collection/handling, submission of specimen other than nasopharyngeal swab, presence of viral mutation(s) within the areas targeted by this assay, and inadequate number of viral copies(<138 copies/mL). A negative result must be combined with clinical observations, patient history, and epidemiological information. The expected result is Negative.  Fact Sheet for Patients:  EntrepreneurPulse.com.au  Fact Sheet for Healthcare Providers:  IncredibleEmployment.be  This test is no t yet approved or cleared by the [place] FDA and  has been authorized for detection and/or diagnosis of SARS-CoV-2 by FDA under an Emergency Use Authorization (EUA). This EUA will remain  in effect (meaning this test can be used) for the duration of the COVID-19 declaration under  Section 564(b)(1) of the Act, 21 U.S.C.section 360bbb-3(b)(1), unless the authorization is terminated  or revoked sooner.       Influenza A by PCR NEGATIVE NEGATIVE Final   Influenza B by PCR NEGATIVE NEGATIVE Final    Comment: (NOTE) The Xpert Xpress SARS-CoV-2/FLU/RSV plus assay is intended as an aid in the diagnosis of influenza from Nasopharyngeal swab specimens and should not be used as a sole basis for treatment. Nasal washings and aspirates are unacceptable for Xpert Xpress SARS-CoV-2/FLU/RSV testing.  Fact Sheet for Patients: EntrepreneurPulse.com.au  Fact Sheet for Healthcare Providers: IncredibleEmployment.be  This test is not yet approved or cleared by the [place] FDA and has been authorized for detection and/or diagnosis of SARS-CoV-2 by FDA under an Emergency Use Authorization (EUA). This EUA will remain in effect (meaning this test can be used) for the duration of the COVID-19 declaration under Section 564(b)(1) of the Act, 21 U.S.C. section 360bbb-3(b)(1), unless the authorization is terminated or revoked.  Performed at [facility], [address] [place], [place] [address]          Radiology Studies: No results found.      Scheduled Meds:  ascorbic acid  1,000 mg Oral Daily   cholecalciferol  4,000 Units Oral Daily   dorzolamide  1 drop Both Eyes Q12H   enoxaparin (LOVENOX) injection  40 mg Subcutaneous Q24H   latanoprost  1 drop Both Eyes QHS   letrozole  2.5 mg Oral Daily   magnesium oxide  400 mg Oral Daily   rosuvastatin  20 mg Oral QHS   sodium chloride  1 g Oral BID WC   Continuous Infusions:  methocarbamol (ROBAXIN) IV       LOS: 4 days    Time spent: 15 mins    Wyvonnia Dusky, MD [facility] Pager [phone]  If 7PM-7AM, please contact night-coverage 04/18/2021, 7:57 AM

## 2021-04-18 NOTE — Progress Notes (Signed)
Physical Therapy Treatment Patient Details Name: Suzanne Nunez MRN: [id number] DOB: [birth year] Today's Date: 04/18/2021   History of Present Illness Amaziah Hribar is an 89yoF admitted [facility] for mechanical fall that resulted in R superior and inferior pubic rami fracture. Pt not an operative candidate; to proceed with WBAT. PMH includes: dementia and HTN.    PT Comments    Pt in bed, pain meds received, apparently pain regimen recently updates and per grandDTR pt is tolerating  mobility much better. ModA to EOB, then extensive cues for minGuard to minA STS from EOB twice. Pt does better with lower walker height. Pt able to take some steps today, but fatigues quickly and needs physical assist for swing phase in gait due to UE strength limitations. Pt left up in chair. HEP reviewed with family as requested. NA informed of need to help with urine incontinence. Author returns with call bell that will allow lights to be turned on.     Recommendations for follow up therapy are one component of a multi-disciplinary discharge planning process, led by the attending physician.  Recommendations may be updated based on patient status, additional functional criteria and insurance authorization.  Follow Up Recommendations  Skilled nursing-short term rehab (<3 hours/day)     Assistance Recommended at Discharge Intermittent Supervision/Assistance  Equipment Recommendations  BSC/3in1    Recommendations for Other Services       Precautions / Restrictions Precautions Precautions: Fall Restrictions RLE Weight Bearing: Weight bearing as tolerated     Mobility  Bed Mobility Overal bed mobility: Needs Assistance Bed Mobility: Sit to Supine     Supine to sit: Mod assist Sit to supine: Mod assist   General bed mobility comments: author supports RLE for antigravity, then provides a hand for patient to pull self to Rt EOB with LUE. No dizziness at EOB, no imbalance.     Transfers Overall transfer level: Needs assistance Equipment used: Rolling walker (2 wheels) (does better from lower, but really needs a YRW to optimize elbow ergonomics) Transfers: Sit to/from Stand;Bed to chair/wheelchair/BSC Sit to Stand: From elevated surface;Min assist Stand pivot transfers: Min assist         General transfer comment: pees a lot with standing despite groin cloth, urine suction device; Will plan on diaper next visit. Pt able to take ~20 labored and tiny steps with extensive cues and intermittent minA of Rt foot and RW. Appears pain controlled; needs cues for upright stnad to achieve terminal elbow extension.    Ambulation/Gait Ambulation/Gait assistance: Min assist Gait Distance (Feet): 2 Feet Assistive device: Rolling walker (2 wheels) (needs a YRW) Gait Pattern/deviations: WFL(Within Functional Limits);Step-to pattern           Stairs             Wheelchair Mobility    Modified Rankin (Stroke Patients Only)       Balance Overall balance assessment: Needs assistance Sitting-balance support: Feet supported Sitting balance-Leahy Scale: Fair     Standing balance support: Bilateral upper extremity supported;During functional activity Standing balance-Leahy Scale: Fair                              Cognition Arousal/Alertness: Awake/alert Behavior During Therapy: WFL for tasks assessed/performed Overall Cognitive Status: Within Functional Limits for tasks assessed  General Comments: good 1 step directive following; increased time for processing        Exercises General Exercises - Lower Extremity Gluteal Sets: Both;Limitations;Seated Gluteal Sets Limitations: demonstrated for grandDTR per request chair version. has not been performing much in bed. Long Arc Quad: AROM;Both;10 reps;Seated;Limitations Long [employer] Limitations: mild limitation guarding on RLE, but can achieve 80%  ROM. Heel Raises: AROM;Both;10 reps;Seated Other Exercises Other Exercises: educated re: role of OT, role of rehab, body mechanics breathing for pain, WB precautions, benefits of oob mobility;    General Comments        Pertinent Vitals/Pain Pain Assessment: Faces Faces Pain Scale: Hurts even more Pain Location: no pain at rest; pain with RLE during WB Pain Descriptors / Indicators: Aching;Grimacing Pain Intervention(s): Limited activity within patient's tolerance    Home Living                          Prior Function            PT Goals (current goals can now be found in the care plan section) Acute Rehab PT Goals Patient Stated Goal: "to walk" PT Goal Formulation: With patient/family Time For Goal Achievement: 04/29/21 Potential to Achieve Goals: Good Progress towards PT goals: Progressing toward goals    Frequency    7X/week      PT Plan Current plan remains appropriate    Co-evaluation              AM-PAC PT "6 Clicks" Mobility   Outcome Measure  Help needed turning from your back to your side while in a flat bed without using bedrails?: A Lot Help needed moving from lying on your back to sitting on the side of a flat bed without using bedrails?: A Lot Help needed moving to and from a bed to a chair (including a wheelchair)?: A Lot Help needed standing up from a chair using your arms (e.g., wheelchair or bedside chair)?: A Lot Help needed to walk in hospital room?: A Lot Help needed climbing 3-5 steps with a railing? : A Lot 6 Click Score: 12    End of Session Equipment Utilized During Treatment: Gait belt Activity Tolerance: Patient tolerated treatment well;Patient limited by fatigue Patient left: in chair;with call bell/phone within reach;with chair alarm set;with family/visitor present Nurse Communication: Mobility status;Patient requests pain meds PT Visit Diagnosis: Unsteadiness on feet (R26.81);Repeated falls (R29.6);Muscle weakness  (generalized) (M62.81);Difficulty in walking, not elsewhere classified (R26.2);Pain Pain - Right/Left: Right Pain - part of body:  (pelvis)     Time: 5056-9794 PT Time Calculation (min) (ACUTE ONLY): 34 min  Charges:  $Gait Training: 8-22 mins $Therapeutic Exercise: 8-22 mins                    1:30 PM, 04/18/21 Etta Grandchild, PT, DPT Physical Therapist - [facility]  [phone] ([place])    [place] C 04/18/2021, 1:22 PM

## 2021-04-18 NOTE — TOC Progression Note (Signed)
Transition of Care [facility]) - Progression Note    Patient Details  Name: Suzanne Nunez MRN: [id number] Date of Birth: [birth year]  Transition of Care [facility]) CM/SW [place], RN Phone Number: 04/18/2021, 12:31 PM  Clinical Narrative:   Late entry from 12/5 RNCM attempted to get insurance auth, patient was not available in [place], so auth was not obtained on 12/5.    Expected Discharge Plan: [place] Barriers to Discharge: Continued Medical Work up  Expected Discharge Plan and Services Expected Discharge Plan: [place] arrangements for the past 2 months: Single Family Home                                       Social Determinants of Health (SDOH) Interventions    Readmission Risk Interventions No flowsheet data found.

## 2021-04-18 NOTE — TOC Progression Note (Signed)
Received the Ins approval for the patient to go to [facility] [id number], start date 12/6 next review date 12/8, anticipate DC 12/7

## 2021-04-18 NOTE — Plan of Care (Signed)

## 2021-04-18 NOTE — TOC Progression Note (Signed)
Transition of Care [facility]) - Progression Note    Patient Details  Name: Suzanne Nunez MRN: [id number] Date of Birth: 12-15-1931  Transition of Care [facility]) CM/SW [place], RN Phone Number: 04/18/2021, 2:33 PM  Clinical Narrative:    [place] still pending   Expected Discharge Plan: [place] Barriers to Discharge: Continued Medical Work up  Expected Discharge Plan and Services Expected Discharge Plan: [place] arrangements for the past 2 months: Single Family Home                                       Social Determinants of Health (SDOH) Interventions    Readmission Risk Interventions No flowsheet data found.

## 2021-04-18 NOTE — Significant Event (Signed)
Donepezil and Robaxin were discontinued as requested by cardiologist.  Suzanne Nunez

## 2021-04-18 NOTE — Progress Notes (Signed)
Occupational Therapy Treatment Patient Details Name: Suzanne Nunez MRN: [id number] DOB: Dec 24, 1931 Today's Date: 04/18/2021   History of present illness Pt admitted to [facility] on 04/14/21 for mechanical fall that resulted in R superior and inferior pubic rami fracture. Pt not an operative candidate; to proceed with WBAT. Significant PMH includes: dementia and HTN.   OT comments  Chart reviewed, pt greeted in bedside chair, granddaughter Lauren present throughout tx session. Pt agreeable to session. Tx session targeted improving independence during functional mobility, ADL completion to facilitate return to PLOF. Pt performs STS with MIN A, SPT to bed with MIN A. Pt performs UB bathing tasks with MIN A, lower body bathing/peri care with MIN A. Intermittent vcs required throughout for sequencing. Overall, pt is making progress towards goal acquisition, will continue to benefit from skilled OT to address functional deficits. Pt is left in bed, NAD, all needs met. RN present. OT will continue to follow while admitted.    Recommendations for follow up therapy are one component of a multi-disciplinary discharge planning process, led by the attending physician.  Recommendations may be updated based on patient status, additional functional criteria and insurance authorization.    Follow Up Recommendations  Skilled nursing-short term rehab (<3 hours/day)    Assistance Recommended at Discharge Frequent or constant Supervision/Assistance  Equipment Recommendations   (per next venue of care)    Recommendations for Other Services      Precautions / Restrictions Precautions Precautions: Fall Restrictions RLE Weight Bearing: Weight bearing as tolerated       Mobility Bed Mobility Overal bed mobility: Needs Assistance Bed Mobility: Sit to Supine       Sit to supine: Mod assist        Transfers Overall transfer level: Needs assistance Equipment used: Rolling walker (2 wheels) Transfers: Sit  to/from Stand;Bed to chair/wheelchair/BSC Sit to Stand: Min assist Stand pivot transfers: Min assist               Balance Overall balance assessment: Needs assistance Sitting-balance support: Feet supported Sitting balance-Leahy Scale: Fair     Standing balance support: Bilateral upper extremity supported;During functional activity Standing balance-Leahy Scale: Fair                             ADL either performed or assessed with clinical judgement   ADL Overall ADL's : Needs assistance/impaired     Grooming: Wash/dry face;Sitting;Cueing for sequencing;Set up   Upper Body Bathing: Minimal assistance;Sitting;Cueing for sequencing Upper Body Bathing Details (indicate cue type and reason): at edge of bed     Upper Body Dressing : Minimal assistance;Sitting;Cueing for sequencing       Toilet Transfer: Minimal assistance;Rolling walker (2 wheels);Cueing for sequencing Toilet Transfer Details (indicate cue type and reason): simulated Toileting- Clothing Manipulation and Hygiene: Minimal assistance;Sit to/from stand Toileting - Clothing Manipulation Details (indicate cue type and reason): peri care in standing     Functional mobility during ADLs: Minimal assistance General ADL Comments: STS MIN A    Extremity/Trunk Assessment              Vision       Perception     Praxis      Cognition Arousal/Alertness: Awake/alert Behavior During Therapy: WFL for tasks assessed/performed Overall Cognitive Status: History of cognitive impairments - at baseline  General Comments: good 1 step directive following; increased time for processing          Exercises Other Exercises Other Exercises: educated re: role of OT, role of rehab, body mechanics breathing for pain, WB precautions, benefits of oob mobility;   Shoulder Instructions       General Comments      Pertinent Vitals/ Pain       Pain  Assessment: Faces Faces Pain Scale: Hurts even more Pain Location: no pain at rest; pain with RLE during WB Pain Descriptors / Indicators: Aching;Grimacing Pain Intervention(s): Limited activity within patient's tolerance  Home Living                                          Prior Functioning/Environment              Frequency  Min 3X/week        Progress Toward Goals  OT Goals(current goals can now be found in the care plan section)  Progress towards OT goals: Progressing toward goals     Plan Discharge plan remains appropriate    Co-evaluation                 AM-PAC OT "6 Clicks" Daily Activity     Outcome Measure   Help from another person eating meals?: None Help from another person taking care of personal grooming?: A Little Help from another person toileting, which includes using toliet, bedpan, or urinal?: A Lot Help from another person bathing (including washing, rinsing, drying)?: A Little Help from another person to put on and taking off regular upper body clothing?: A Little Help from another person to put on and taking off regular lower body clothing?: A Lot 6 Click Score: 17    End of Session Equipment Utilized During Treatment: Gait belt;Rolling walker (2 wheels)  OT Visit Diagnosis: Unsteadiness on feet (R26.81);Other abnormalities of gait and mobility (R26.89);Repeated falls (R29.6);Muscle weakness (generalized) (M62.81)   Activity Tolerance Patient limited by pain   Patient Left in bed;with call bell/phone within reach;with family/visitor present   Nurse Communication Mobility status        Time: 1137-1208 OT Time Calculation (min): 31 min  Charges: OT General Charges $OT Visit: 1 Visit OT Treatments $Self Care/Home Management : 8-22 mins $Therapeutic Activity: 8-22 mins  Shanon Payor, OTD OTR/L  04/18/21, 12:28 PM

## 2021-04-19 DIAGNOSIS — W19XXXA Unspecified fall, initial encounter: Secondary | ICD-10-CM

## 2021-04-19 DIAGNOSIS — R112 Nausea with vomiting, unspecified: Secondary | ICD-10-CM

## 2021-04-19 DIAGNOSIS — S3282XA Multiple fractures of pelvis without disruption of pelvic ring, initial encounter for closed fracture: Secondary | ICD-10-CM | POA: Diagnosis not present

## 2021-04-19 DIAGNOSIS — S72009A Fracture of unspecified part of neck of unspecified femur, initial encounter for closed fracture: Secondary | ICD-10-CM | POA: Diagnosis not present

## 2021-04-19 LAB — CBC
HCT: 36.7 % (ref 36.0–46.0)
Hemoglobin: 12.1 g/dL (ref 12.0–15.0)
MCH: 30.9 pg (ref 26.0–34.0)
MCHC: 33 g/dL (ref 30.0–36.0)
MCV: 93.6 fL (ref 80.0–100.0)
Platelets: 319 10*3/uL (ref 150–400)
RBC: 3.92 MIL/uL (ref 3.87–5.11)
RDW: 13.5 % (ref 11.5–15.5)
WBC: 9.3 10*3/uL (ref 4.0–10.5)
nRBC: 0 % (ref 0.0–0.2)

## 2021-04-19 LAB — BASIC METABOLIC PANEL
Anion gap: 6 (ref 5–15)
BUN: 26 mg/dL — ABNORMAL HIGH (ref 8–23)
CO2: 28 mmol/L (ref 22–32)
Calcium: 9 mg/dL (ref 8.9–10.3)
Chloride: 94 mmol/L — ABNORMAL LOW (ref 98–111)
Creatinine, Ser: 0.97 mg/dL (ref 0.44–1.00)
GFR, Estimated: 57 mL/min — ABNORMAL LOW (ref >60)
Glucose, Bld: 113 mg/dL — ABNORMAL HIGH (ref 70–99)
Potassium: 3.9 mmol/L (ref 3.5–5.1)
Sodium: 128 mmol/L — ABNORMAL LOW (ref 135–145)

## 2021-04-19 MED ORDER — SENNA 8.6 MG PO TABS
1.0000 | ORAL_TABLET | Freq: Every day | ORAL | Status: DC
  Administered 2021-04-19: 8.6 mg via ORAL
  Filled 2021-04-19: qty 1

## 2021-04-19 MED ORDER — POLYETHYLENE GLYCOL 3350 17 G PO PACK: 17.0000 g | PACK | Freq: Every day | ORAL | 0 refills | Status: DC | PRN

## 2021-04-19 MED ORDER — SENNA 8.6 MG PO TABS: 1.0000 | ORAL_TABLET | Freq: Every day | ORAL | 0 refills | Status: AC

## 2021-04-19 MED ORDER — TRAMADOL HCL 50 MG PO TABS: 50.0000 mg | ORAL_TABLET | Freq: Four times a day (QID) | ORAL | 0 refills | Status: AC | PRN

## 2021-04-19 MED ORDER — OXYCODONE HCL 5 MG PO TABS: 2.5000 mg | ORAL_TABLET | Freq: Four times a day (QID) | ORAL | 0 refills | Status: DC | PRN

## 2021-04-19 MED ORDER — DORZOLAMIDE HCL 2 % OP SOLN: 1.0000 [drp] | Freq: Two times a day (BID) | OPHTHALMIC | Status: DC

## 2021-04-19 MED ORDER — SODIUM CHLORIDE 1 G PO TABS: 1.0000 g | ORAL_TABLET | Freq: Three times a day (TID) | ORAL | Status: DC

## 2021-04-19 NOTE — Discharge Summary (Addendum)
Physician Discharge Summary  Suzanne Nunez IWP:[id number] DOB: 21-Nov-1931 DOA: 04/14/2021  PCP: Pcp, No  Admit date: 04/14/2021 Discharge date: 04/19/2021  Admitted From: Home Disposition: Skilled nursing facility  Recommendations for Outpatient Follow-up:  Follow up with PCP within 1 week to monitor sodium level Follow-up with Ortho surgery in 4 weeks for pelvic x-ray Follow-up with cardiology in 30 days to evaluate Holter monitor results.  Discharge Condition: Stable, improved CODE STATUS:  Code Status: Full Code  Regular healthy diet  Brief/Interim Summary: Pt presented to the hospital after sustaining right superior and inferior pubic rami fracture from a fall at home.  She was evaluated by Ortho surgery and they recommended nonsurgical treatment.  She worked with physical therapy Inpatient and they recommended further SNF therapy on discharge.  She progressed well with moderate pain. Patient was witnessed to have sinus bradycardia with junctional escape beats/sinus rhythm with first-degree AV block.  She was evaluated by cardiology who recommended a 30-day Holter monitor for evaluation and follow-up outpatient.  They recommended holding any QT prolonging agents such as Zofran, donepezil, Robaxin. Has history of hyponatremia which treated with salt tablets.  Her sodium level remained stable around 130 and she was asymptomatic.  Her dose was increased to 3 times daily salt tablets on day of discharge.  Discharge Diagnoses:  Principal Problem:   Hip fracture [facility]) Active Problems:   Fall   Intractable nausea and vomiting   Multiple closed pelvic fractures without disruption of pelvic circle (HCC)   Allergies as of 04/19/2021   No Known Allergies      Medication List     STOP taking these medications    Glucosamine Chondr 1500 Complx Caps   senna-docusate 8.6-50 MG tablet Commonly known as: Senokot-S   zinc sulfate 220 (50 Zn) MG capsule       TAKE these  medications    acetaminophen 500 MG tablet Commonly known as: TYLENOL Take 500-1,000 mg by mouth every 6 (six) hours as needed for mild pain or moderate pain.   ascorbic acid 1000 MG tablet Commonly known as: VITAMIN C Take 1,000 mg by mouth daily.   chlorthalidone 25 MG tablet Commonly known as: HYGROTON Take 25 mg by mouth daily.   dorzolamide 2 % ophthalmic solution Commonly known as: TRUSOPT Place 1 drop into both eyes every 12 (twelve) hours.   dorzolamide-timolol 22.3-6.8 MG/ML ophthalmic solution Commonly known as: COSOPT Place 1 drop into both eyes 2 times daily at 12 noon and 4 pm.   latanoprost 0.005 % ophthalmic solution Commonly known as: XALATAN Place 1 drop into both eyes at bedtime.   letrozole 2.5 MG tablet Commonly known as: FEMARA Take 2.5 mg by mouth daily.   magnesium oxide 400 MG tablet Commonly known as: MAG-OX Take 400 mg by mouth daily.   polyethylene glycol 17 g packet Commonly known as: MIRALAX / GLYCOLAX Take 17 g by mouth daily as needed for mild constipation.   rosuvastatin 20 MG tablet Commonly known as: CRESTOR Take 20 mg by mouth at bedtime.   senna 8.6 MG Tabs tablet Commonly known as: SENOKOT Take 1 tablet (8.6 mg total) by mouth daily.   SM Vitamin D3 100 MCG (4000 UT) Caps Generic drug: Cholecalciferol Take 4,000 Units by mouth daily.   sodium chloride 1 g tablet Take 1 tablet (1 g total) by mouth 3 (three) times daily with meals.   traMADol 50 MG tablet Commonly known as: ULTRAM Take 1 tablet (50 mg total) by mouth every  6 (six) hours as needed for up to 5 days for moderate pain or severe pain.        Contact information for follow-up providers     Corey Skains, MD Follow up on 05/24/2021.   Specialty: Cardiology Why: AT 245 Contact information: [facility] [place] [address] [phone]              Contact information for after-discharge care      [place] [facility] Preferred SNF .   Service: Skilled Nursing Contact information: [place] [phone]                    No Known Allergies  Consultations: Orthopedic surgery Cardiology  Procedures/Studies: CT Head Wo Contrast  Result Date: 04/14/2021 CLINICAL DATA:  Trauma EXAM: CT HEAD WITHOUT CONTRAST TECHNIQUE: Contiguous axial images were obtained from the base of the skull through the vertex without intravenous contrast. COMPARISON:  None. FINDINGS: Brain: Chronic white matter ischemic change. No evidence of acute infarction, hemorrhage, hydrocephalus, extra-axial collection or mass lesion/mass effect. Vascular: No hyperdense vessel or unexpected calcification. Skull: Normal. Negative for fracture or focal lesion. Sinuses/Orbits: Partial opacification of the right sphenoid sinus. No acute finding. Other: None. IMPRESSION: No acute intracranial abnormality. Electronically Signed   By: Yetta Glassman M.D.   On: 04/14/2021 11:39   CT PELVIS WO CONTRAST  Result Date: 04/14/2021 CLINICAL DATA:  Fall, evaluate right pubic fractures EXAM: CT PELVIS WITHOUT CONTRAST TECHNIQUE: Multidetector CT imaging of the pelvis was performed following the standard protocol without intravenous contrast. COMPARISON:  Same day radiographs of the right hip FINDINGS: Urinary Tract:  No abnormality visualized. Bowel:  Sigmoid diverticulosis. Vascular/Lymphatic: No pathologically enlarged lymph nodes. Aortic atherosclerosis. Reproductive:  No mass or other significant abnormality Other:  None. Musculoskeletal: No suspicious bone lesions identified. Osteopenia. Comminuted fractures of the right superior and inferior pubic rami about the pubic symphysis (series 2, image 81) with additional minimally displaced fracture of the mid right inferior pubic ramus (series 2, image 98).  Additional minimally displaced fractures of the right sacral ala (series 2, image 37). IMPRESSION: 1. Comminuted fractures of the right superior and inferior pubic rami about the pubic symphysis with additional minimally displaced fracture of the mid right inferior pubic ramus, as seen on prior radiographs. No involvement of the acetabulum. 2. Additional minimally displaced fractures of the right sacral ala. Aortic Atherosclerosis (ICD10-I70.0). Electronically Signed   By: Delanna Ahmadi M.D.   On: 04/14/2021 13:20   DG Abd Portable 1V  Result Date: 04/18/2021 CLINICAL DATA:  Nausea, vomiting. EXAM: PORTABLE ABDOMEN - 1 VIEW COMPARISON:  None. FINDINGS: The bowel gas pattern is normal. No radio-opaque calculi are noted. Mildly displaced fractures are seen involving the right inferior and superior pubic rami of indeterminate age. IMPRESSION: No abnormal bowel dilatation is noted. Right inferior and superior pubic rami fractures are noted of indeterminate age. Electronically Signed   By: Marijo Conception M.D.   On: 04/18/2021 14:36   DG Hip Unilat  With Pelvis 2-3 Views Right  Result Date: 04/14/2021 CLINICAL DATA:  Fall, hip pain EXAM: DG HIP (WITH OR WITHOUT PELVIS) 2-3V RIGHT COMPARISON:  None. FINDINGS: Acute comminuted fractures of the right parasymphyseal pubic bone and superior and inferior pubic rami, with minimal displacement of fracture fragments. No acute fracture of the proximal right femur. No  hip dislocation. IMPRESSION: Acute fractures of the right pubic bone. Electronically Signed   By: Ofilia Neas M.D.   On: 04/14/2021 12:01    Subjective: Patient feels improved today.  Pain is well controlled with medication.  Her mobility has improved since admission.  She is ready to go to SNF.  Discharge Exam: Vitals:   04/19/21 0736 04/19/21 1132  BP: (!) 160/53 140/60  Pulse: 71 77  Resp: 16 16  Temp: 98.4 F (36.9 C) 98.6 F (37 C)  SpO2: 98% 98%    General: Pt is alert, awake, not  in acute distress Cardiovascular: RRR, S1/S2 +, no rubs, no gallops Respiratory: CTA bilaterally, no wheezing, no rhonchi Abdominal: Soft, NT, ND, bowel sounds + Extremities: no edema, no cyanosis.  Able to spontaneously move bilateral lower extremities in bed with mild to moderate pain worse on right.  Labs: Basic Metabolic Panel: Recent Labs  Lab 04/15/21 0806 04/16/21 0628 04/17/21 0443 04/18/21 0419 04/19/21 0212  NA 128* 127* 133* 131* 128*  K 3.6 3.1* 3.9 4.1 3.9  CL 90* 90* 96* 93* 94*  CO2 31 30 31 30 28   GLUCOSE 117* 125* 116* 127* 113*  BUN 26* 17 19 27* 26*  CREATININE 1.00 0.81 0.87 1.04* 0.97  CALCIUM 8.8* 8.9 9.5 9.4 9.0   CBC: Recent Labs  Lab 04/14/21 1440 04/15/21 0806 04/16/21 0628 04/17/21 0443 04/18/21 0419 04/19/21 0212  WBC 14.8* 8.7 9.4 7.4 10.2 9.3  NEUTROABS 12.6*  --   --   --   --   --   HGB 13.7 12.5 13.0 13.2 12.4 12.1  HCT 40.5 37.2 38.3 39.4 37.8 36.7  MCV 92.3 93.2 93.9 95.6 95.7 93.6  PLT 309 278 283 292 321 319    Microbiology Recent Results (from the past 240 hour(s))  Resp Panel by RT-PCR (Flu A&B, Covid) Nasopharyngeal Swab     Status: None   Collection Time: 04/14/21  2:56 PM   Specimen: Nasopharyngeal Swab; Nasopharyngeal(NP) swabs in vial transport medium  Result Value Ref Range Status   SARS Coronavirus 2 by RT PCR NEGATIVE NEGATIVE Final    Comment: (NOTE) SARS-CoV-2 target nucleic acids are NOT DETECTED.  The SARS-CoV-2 RNA is generally detectable in upper respiratory specimens during the acute phase of infection. The lowest concentration of SARS-CoV-2 viral copies this assay can detect is 138 copies/mL. A negative result does not preclude SARS-Cov-2 infection and should not be used as the sole basis for treatment or other patient management decisions. A negative result may occur with  improper specimen collection/handling, submission of specimen other than nasopharyngeal swab, presence of viral mutation(s) within  the areas targeted by this assay, and inadequate number of viral copies(<138 copies/mL). A negative result must be combined with clinical observations, patient history, and epidemiological information. The expected result is Negative.  Fact Sheet for Patients:  EntrepreneurPulse.com.au  Fact Sheet for Healthcare Providers:  IncredibleEmployment.be  This test is no t yet approved or cleared by the [place] FDA and  has been authorized for detection and/or diagnosis of SARS-CoV-2 by FDA under an Emergency Use Authorization (EUA). This EUA will remain  in effect (meaning this test can be used) for the duration of the COVID-19 declaration under Section 564(b)(1) of the Act, 21 U.S.C.section 360bbb-3(b)(1), unless the authorization is terminated  or revoked sooner.       Influenza A by PCR NEGATIVE NEGATIVE Final   Influenza B by PCR NEGATIVE NEGATIVE Final    Comment: (NOTE)  The Xpert Xpress SARS-CoV-2/FLU/RSV plus assay is intended as an aid in the diagnosis of influenza from Nasopharyngeal swab specimens and should not be used as a sole basis for treatment. Nasal washings and aspirates are unacceptable for Xpert Xpress SARS-CoV-2/FLU/RSV testing.  Fact Sheet for Patients: EntrepreneurPulse.com.au  Fact Sheet for Healthcare Providers: IncredibleEmployment.be  This test is not yet approved or cleared by the [place] FDA and has been authorized for detection and/or diagnosis of SARS-CoV-2 by FDA under an Emergency Use Authorization (EUA). This EUA will remain in effect (meaning this test can be used) for the duration of the COVID-19 declaration under Section 564(b)(1) of the Act, 21 U.S.C. section 360bbb-3(b)(1), unless the authorization is terminated or revoked.  Performed at [facility], [address] [place], [place] [address]   Resp Panel by RT-PCR (Flu A&B, Covid)  Nasopharyngeal Swab     Status: None   Collection Time: 04/18/21  3:49 PM   Specimen: Nasopharyngeal Swab; Nasopharyngeal(NP) swabs in vial transport medium  Result Value Ref Range Status   SARS Coronavirus 2 by RT PCR NEGATIVE NEGATIVE Final    Comment: (NOTE) SARS-CoV-2 target nucleic acids are NOT DETECTED.  The SARS-CoV-2 RNA is generally detectable in upper respiratory specimens during the acute phase of infection. The lowest concentration of SARS-CoV-2 viral copies this assay can detect is 138 copies/mL. A negative result does not preclude SARS-Cov-2 infection and should not be used as the sole basis for treatment or other patient management decisions. A negative result may occur with  improper specimen collection/handling, submission of specimen other than nasopharyngeal swab, presence of viral mutation(s) within the areas targeted by this assay, and inadequate number of viral copies(<138 copies/mL). A negative result must be combined with clinical observations, patient history, and epidemiological information. The expected result is Negative.  Fact Sheet for Patients:  EntrepreneurPulse.com.au  Fact Sheet for Healthcare Providers:  IncredibleEmployment.be  This test is no t yet approved or cleared by the [place] FDA and  has been authorized for detection and/or diagnosis of SARS-CoV-2 by FDA under an Emergency Use Authorization (EUA). This EUA will remain  in effect (meaning this test can be used) for the duration of the COVID-19 declaration under Section 564(b)(1) of the Act, 21 U.S.C.section 360bbb-3(b)(1), unless the authorization is terminated  or revoked sooner.       Influenza A by PCR NEGATIVE NEGATIVE Final   Influenza B by PCR NEGATIVE NEGATIVE Final    Comment: (NOTE) The Xpert Xpress SARS-CoV-2/FLU/RSV plus assay is intended as an aid in the diagnosis of influenza from Nasopharyngeal swab specimens and should not be  used as a sole basis for treatment. Nasal washings and aspirates are unacceptable for Xpert Xpress SARS-CoV-2/FLU/RSV testing.  Fact Sheet for Patients: EntrepreneurPulse.com.au  Fact Sheet for Healthcare Providers: IncredibleEmployment.be  This test is not yet approved or cleared by the [place] FDA and has been authorized for detection and/or diagnosis of SARS-CoV-2 by FDA under an Emergency Use Authorization (EUA). This EUA will remain in effect (meaning this test can be used) for the duration of the COVID-19 declaration under Section 564(b)(1) of the Act, 21 U.S.C. section 360bbb-3(b)(1), unless the authorization is terminated or revoked.  Performed at [facility], [address] [place], [place] [address]     Time coordinating discharge: Over 30 minutes  Richarda Osmond, MD  [facility] 04/19/2021, 12:16 PM Pager   If 7PM-7AM, please contact night-coverage [email or web address] Password [email or web address]

## 2021-04-19 NOTE — TOC Progression Note (Signed)
Transition of Care [facility] - Progression Note    Patient Details  Name: Suzanne Nunez MRN: [id number] Date of Birth: [birth year]  [facility]) CM/SW [place], RN Phone Number: 04/19/2021, 11:10 AM  Clinical Narrative:   The patient is to DC to [facility], via emS, I called the grand daughter Ander Purpura and let her know, Called EMS to transport    Expected Discharge Plan: [place] Barriers to Discharge: Continued Medical Work up  Expected Discharge Plan and Services Expected Discharge Plan: [place] arrangements for the past 2 months: Single Family Home Expected Discharge Date: 04/19/21                                     Social Determinants of Health (SDOH) Interventions    Readmission Risk Interventions No flowsheet data found.

## 2021-04-19 NOTE — Progress Notes (Signed)
Suzanne Nunez to be D/C'd [employer] per MD order.  Report called to Melody RN.   Allergies as of 04/19/2021   No Known Allergies      Medication List     STOP taking these medications    Glucosamine Chondr 1500 Complx Caps   senna-docusate 8.6-50 MG tablet Commonly known as: Senokot-S   zinc sulfate 220 (50 Zn) MG capsule       TAKE these medications    acetaminophen 500 MG tablet Commonly known as: TYLENOL Take 500-1,000 mg by mouth every 6 (six) hours as needed for mild pain or moderate pain.   ascorbic acid 1000 MG tablet Commonly known as: VITAMIN C Take 1,000 mg by mouth daily.   chlorthalidone 25 MG tablet Commonly known as: HYGROTON Take 25 mg by mouth daily.   dorzolamide 2 % ophthalmic solution Commonly known as: TRUSOPT Place 1 drop into both eyes every 12 (twelve) hours.   dorzolamide-timolol 22.3-6.8 MG/ML ophthalmic solution Commonly known as: COSOPT Place 1 drop into both eyes 2 times daily at 12 noon and 4 pm.   latanoprost 0.005 % ophthalmic solution Commonly known as: XALATAN Place 1 drop into both eyes at bedtime.   letrozole 2.5 MG tablet Commonly known as: FEMARA Take 2.5 mg by mouth daily.   magnesium oxide 400 MG tablet Commonly known as: MAG-OX Take 400 mg by mouth daily.   polyethylene glycol 17 g packet Commonly known as: MIRALAX / GLYCOLAX Take 17 g by mouth daily as needed for mild constipation.   rosuvastatin 20 MG tablet Commonly known as: CRESTOR Take 20 mg by mouth at bedtime.   senna 8.6 MG Tabs tablet Commonly known as: SENOKOT Take 1 tablet (8.6 mg total) by mouth daily.   SM Vitamin D3 100 MCG (4000 UT) Caps Generic drug: Cholecalciferol Take 4,000 Units by mouth daily.   sodium chloride 1 g tablet Take 1 tablet (1 g total) by mouth 3 (three) times daily with meals.   traMADol 50 MG tablet Commonly known as: ULTRAM Take 1 tablet (50 mg total) by mouth every 6 (six) hours as needed for up to 5  days for moderate pain or severe pain.        Vitals:   04/19/21 0736 04/19/21 1132  BP: (!) 160/53 140/60  Pulse: 71 77  Resp: 16 16  Temp: 98.4 F (36.9 C) 98.6 F (37 C)  SpO2: 98% 98%    Tele box removed and returned. Skin clean, dry and intact without evidence of skin break down, no evidence of skin tears noted. IV catheter discontinued intact. Site without signs and symptoms of complications. Dressing and pressure applied. Pt denies pain at this time. No complaints noted.  An After Visit Summary was printed and given to the patient. Patient escorted via stretcher, and D/C to SNF via EMS  Rolley Sims

## 2021-04-19 NOTE — Progress Notes (Signed)
[facility] Cardiology [facility] Encounter Note  Patient: Suzanne Nunez / Admit Date: 04/14/2021 / Date of Encounter: 04/19/2021, 9:12 AM   Subjective: Patient overall improved since admission with less than orthopedic discomfort at this time.  Patient did have episode of sinus bradycardia with junctional escape and 4 beats of multifocal ventricular tachycardia asymptomatic at this time but could be potentially related to her previous fall.  History is difficult to assess.  She has had a long-term telemetry patch placed for further evaluation  Review of Systems: Positive for: Right hip pain Negative for: Vision change, hearing change, syncope, dizziness, nausea, vomiting,diarrhea, bloody stool, stomach pain, cough, congestion, diaphoresis, urinary frequency, urinary pain,skin lesions, skin rashes Others previously listed  Objective: Telemetry: Review from 11:27 AM showed sinus bradycardia with junctional escape beats with rate of 38 bpm. 5 beats of V. tach noted.  At 1343 sinus rhythm with first-degree AV block and ventricular rate 72 bpm  Physical Exam: Blood pressure (!) 148/59, pulse 70, temperature 97.7 F (36.5 C), temperature source Oral, resp. rate 18, height 5\' 3"  (1.6 m), weight 59 kg, SpO2 97 %. Body mass index is 23.03 kg/m. General: Elderly appearing Caucasian female, well nourished, in no acute distress.  Lying comfortably in bed at incline.  Head: Normocephalic, atraumatic, sclera non-icteric, no xanthomas,  Neck: No apparent masses Lungs: Normal respiratory effort with no wheezes, no rhonchi, no rales , no crackles  Heart: Regular rate and rhythm, normal S1 S2, no murmur, no rub, no gallop, Abdomen: Soft, non-tender, non-distended. No apparent hepatosplenomegaly.  Extremities: No edema, no clubbing, no cyanosis, no ulcers,  Peripheral: 2+ radial, 2+ dorsal pedal pulses Neuro: Alert and oriented. Moves all extremities spontaneously. Psych:  Responds to questions  appropriately with a normal affect.   Intake/Output Summary (Last 24 hours) at 04/19/2021 0912 Last data filed at 04/19/2021 0500 Gross per 24 hour  Intake --  Output 1750 ml  Net -1750 ml     Inpatient Medications:  . ascorbic acid  1,000 mg Oral Daily  . cholecalciferol  4,000 Units Oral Daily  . dorzolamide  1 drop Both Eyes Q12H  . enoxaparin (LOVENOX) injection  40 mg Subcutaneous Q24H  . latanoprost  1 drop Both Eyes QHS  . letrozole  2.5 mg Oral Daily  . magnesium oxide  400 mg Oral Daily  . rosuvastatin  20 mg Oral QHS  . sodium chloride  1 g Oral BID WC   Infusions:  . promethazine (PHENERGAN) injection (IM or IVPB)      Labs: Recent Labs    04/18/21 0419 04/19/21 0212  NA 131* 128*  K 4.1 3.9  CL 93* 94*  CO2 30 28  GLUCOSE 127* 113*  BUN 27* 26*  CREATININE 1.04* 0.97  CALCIUM 9.4 9.0    No results for input(s): AST, ALT, ALKPHOS, BILITOT, PROT, ALBUMIN in the last 72 hours. Recent Labs    04/18/21 0419 04/19/21 0212  WBC 10.2 9.3  HGB 12.4 12.1  HCT 37.8 36.7  MCV 95.7 93.6  PLT 321 319    No results for input(s): CKTOTAL, CKMB, TROPONINI in the last 72 hours. Invalid input(s): POCBNP No results for input(s): HGBA1C in the last 72 hours.   Weights: Filed Weights   04/14/21 1015  Weight: 59 kg     Radiology/Studies:  CT Head Wo Contrast  Result Date: 04/14/2021 CLINICAL DATA:  Trauma EXAM: CT HEAD WITHOUT CONTRAST TECHNIQUE: Contiguous axial images were obtained from the base of the skull  through the vertex without intravenous contrast. COMPARISON:  None. FINDINGS: Brain: Chronic white matter ischemic change. No evidence of acute infarction, hemorrhage, hydrocephalus, extra-axial collection or mass lesion/mass effect. Vascular: No hyperdense vessel or unexpected calcification. Skull: Normal. Negative for fracture or focal lesion. Sinuses/Orbits: Partial opacification of the right sphenoid sinus. No acute finding. Other: None. IMPRESSION: No  acute intracranial abnormality. Electronically Signed   By: Yetta Glassman M.D.   On: 04/14/2021 11:39   CT PELVIS WO CONTRAST  Result Date: 04/14/2021 CLINICAL DATA:  Fall, evaluate right pubic fractures EXAM: CT PELVIS WITHOUT CONTRAST TECHNIQUE: Multidetector CT imaging of the pelvis was performed following the standard protocol without intravenous contrast. COMPARISON:  Same day radiographs of the right hip FINDINGS: Urinary Tract:  No abnormality visualized. Bowel:  Sigmoid diverticulosis. Vascular/Lymphatic: No pathologically enlarged lymph nodes. Aortic atherosclerosis. Reproductive:  No mass or other significant abnormality Other:  None. Musculoskeletal: No suspicious bone lesions identified. Osteopenia. Comminuted fractures of the right superior and inferior pubic rami about the pubic symphysis (series 2, image 81) with additional minimally displaced fracture of the mid right inferior pubic ramus (series 2, image 98). Additional minimally displaced fractures of the right sacral ala (series 2, image 37). IMPRESSION: 1. Comminuted fractures of the right superior and inferior pubic rami about the pubic symphysis with additional minimally displaced fracture of the mid right inferior pubic ramus, as seen on prior radiographs. No involvement of the acetabulum. 2. Additional minimally displaced fractures of the right sacral ala. Aortic Atherosclerosis (ICD10-I70.0). Electronically Signed   By: Delanna Ahmadi M.D.   On: 04/14/2021 13:20   DG Abd Portable 1V  Result Date: 04/18/2021 CLINICAL DATA:  Nausea, vomiting. EXAM: PORTABLE ABDOMEN - 1 VIEW COMPARISON:  None. FINDINGS: The bowel gas pattern is normal. No radio-opaque calculi are noted. Mildly displaced fractures are seen involving the right inferior and superior pubic rami of indeterminate age. IMPRESSION: No abnormal bowel dilatation is noted. Right inferior and superior pubic rami fractures are noted of indeterminate age. Electronically Signed    By: Marijo Conception M.D.   On: 04/18/2021 14:36   DG Hip Unilat  With Pelvis 2-3 Views Right  Result Date: 04/14/2021 CLINICAL DATA:  Fall, hip pain EXAM: DG HIP (WITH OR WITHOUT PELVIS) 2-3V RIGHT COMPARISON:  None. FINDINGS: Acute comminuted fractures of the right parasymphyseal pubic bone and superior and inferior pubic rami, with minimal displacement of fracture fragments. No acute fracture of the proximal right femur. No hip dislocation. IMPRESSION: Acute fractures of the right pubic bone. Electronically Signed   By: Ofilia Neas M.D.   On: 04/14/2021 12:01     Assessment and Recommendation  87 y.o. female with a past medical history of dementia and hypertension who presented to [facility] on 12/[facility] via EMS after an unwitnessed fall.     #Sinus bradycardia with junctional escape beats/sinus rhythm with first-degree AV block -Patient is asymptomatic at the time and denies any dizziness or passing out -Discussed with patient and granddaughter the possibility of pacemaker placement based off holter monitor results but it seems unlikely at this time without current symptoms -It would be important to discontinue QT prolonging agents like Zofran that can lead to episodes of  non  sustained V. tach -recommend discontinuing as needed Robaxin and donepezil as these can have side effects of bradycardia  -30-day Holter monitor applied for further monitoring of patient's heart rate and she can follow up in office for further management -Okay for discharged home and/or rehab  facility from the cardiac standpoint with follow-up in the next several weeks #Hypertension #Hyperlipidemia -Continue as needed hydralazine and Crestor    Serafina Royals MD [facility]

## 2021-06-27 ENCOUNTER — Encounter: Payer: Self-pay | Admitting: *Deleted

## 2021-06-27 ENCOUNTER — Other Ambulatory Visit: Payer: Self-pay | Admitting: *Deleted

## 2021-07-04 ENCOUNTER — Inpatient Hospital Stay: Payer: Medicare PPO

## 2021-07-04 ENCOUNTER — Other Ambulatory Visit: Payer: Self-pay

## 2021-07-04 ENCOUNTER — Inpatient Hospital Stay: Payer: Medicare PPO | Attending: Oncology | Admitting: Oncology

## 2021-07-04 VITALS — BP 124/60 | HR 49 | Temp 98.0°F | Resp 16 | Ht 63.0 in | Wt 139.0 lb

## 2021-07-04 DIAGNOSIS — M8580 Other specified disorders of bone density and structure, unspecified site: Secondary | ICD-10-CM | POA: Insufficient documentation

## 2021-07-04 DIAGNOSIS — Z79899 Other long term (current) drug therapy: Secondary | ICD-10-CM | POA: Diagnosis not present

## 2021-07-04 DIAGNOSIS — Z923 Personal history of irradiation: Secondary | ICD-10-CM | POA: Insufficient documentation

## 2021-07-04 DIAGNOSIS — Z853 Personal history of malignant neoplasm of breast: Secondary | ICD-10-CM

## 2021-07-04 DIAGNOSIS — Z170 Estrogen receptor positive status [ER+]: Secondary | ICD-10-CM | POA: Insufficient documentation

## 2021-07-04 DIAGNOSIS — F0390 Unspecified dementia without behavioral disturbance: Secondary | ICD-10-CM | POA: Diagnosis not present

## 2021-07-04 DIAGNOSIS — I10 Essential (primary) hypertension: Secondary | ICD-10-CM | POA: Insufficient documentation

## 2021-07-04 DIAGNOSIS — C50911 Malignant neoplasm of unspecified site of right female breast: Secondary | ICD-10-CM | POA: Diagnosis not present

## 2021-07-04 DIAGNOSIS — Z903 Acquired absence of stomach [part of]: Secondary | ICD-10-CM | POA: Diagnosis not present

## 2021-07-04 MED ORDER — LETROZOLE 2.5 MG PO TABS: 2.5000 mg | ORAL_TABLET | Freq: Every day | ORAL | 3 refills | Status: AC

## 2021-07-06 ENCOUNTER — Encounter: Payer: Self-pay | Admitting: Oncology

## 2021-07-06 NOTE — Progress Notes (Signed)
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  She had lumpectomy followed by adjuvant radiation therapy and 5 years of letrozole ending in June 2015.  She had baseline osteopenia in the past which was treated with Fosamax and level system sequently discontinued.  She was then noted to have right breast mass on screening mammogram in early 2022 and underwent bilateral mastectomy.  Final pathology showed grade 2 invasive lobular carcinoma with 2 foci in the largest focus was 21 mm with a small focus of 7 mm.  2 out of 5 lymph nodes positive for metastases with negative margins.  She was not deemed to be a candidate for chemotherapy.  She had adjuvant radiation treatment and started Femara in October 2022.  She was seen at [facility] Dr. Carollee Herter.  She has had worsening cognitive issues and currently moved to [facility] to stay with her granddaughter.  Presently the concern is that her cognitive status is declining quickly and she would be having a hospice informational visit as well soon.   ECOG PS- 3  Pain scale- 0   Review of systems-history limited due to dementia Review of Systems  Constitutional:  Positive for malaise/fatigue. Negative for chills, fever and weight loss.  HENT:  Negative for congestion, ear discharge and nosebleeds.   Eyes:  Negative for blurred vision.  Respiratory:  Negative for  cough, hemoptysis, sputum production, shortness of breath and wheezing.   Cardiovascular:  Negative for chest pain, palpitations, orthopnea and claudication.  Gastrointestinal:  Negative for abdominal pain, blood in stool, constipation, diarrhea, heartburn, melena, nausea and vomiting.  Genitourinary:  Negative for dysuria, flank pain, frequency, hematuria and urgency.  Musculoskeletal:  Negative for back pain, joint pain and myalgias.  Skin:  Negative for rash.  Neurological:  Negative for dizziness, tingling, focal weakness, seizures, weakness and headaches.  Endo/Heme/Allergies:  Does not bruise/bleed easily.  Psychiatric/Behavioral:  Negative for depression and suicidal ideas. The patient does not have insomnia.    Allergies  Allergen Reactions   Ace Inhibitors Swelling    Other reaction(s): Angioedema    Cortisone Other (See Comments)     Increased blood pressure & heart rate Increased blood pressure & heart rate    Codeine Other (See Comments)    Chest pain Chest pain    Dextromethorphan-Guaifenesin Other (See Comments)      CHEST PAIN. Other Reaction: CHEST PAIN. Other reaction(s): Other (See Comments) Other Reaction: CHEST PAIN.    Other     Son reports whole system is affected and pt is sensitive to everything;  Memory is still delayed from anesthesia 3 months ago    Patient Active Problem List   Diagnosis Date Noted   Fall    Intractable nausea and vomiting    Multiple closed pelvic fractures without disruption of pelvic circle ([place])    Hip fracture ([place]) 04/14/2021     Past Medical History:  Diagnosis Date   Anemia  Arthritis    BRCA negative    Breast cancer ([place]) 09/2020   right breast   Breast cancer ([place]) 10/13/2008   Carpal tunnel syndrome    Dementia arising in the senium and presenium ([place])    Dyslipidemia    Glaucoma    Hypertension    Neuropathy    Osteopenia after menopause    Personal history of radiation therapy 2010   after breast  cancer     Past Surgical History:  Procedure Laterality Date   BLADDER SURGERY  2017   CARDIAC CATHETERIZATION  2003   CATARACT EXTRACTION     cyctocele repair  06/07/2020   MASTECTOMY Right 09/16/2020   MASTECTOMY Left    2010   TRIGGER FINGER RELEASE N/A    VAGINAL PROLAPSE REPAIR  06/07/2020    Social History   Socioeconomic History   Marital status: Widowed    Spouse name: Not on file   Number of children: Not on file   Years of education: Not on file   Highest education level: Not on file  Occupational History   Not on file  Tobacco Use   Smoking status: Never   Smokeless tobacco: Never  Vaping Use   Vaping Use: Never used  Substance and Sexual Activity   Alcohol use: Never   Drug use: Not Currently   Sexual activity: Not Currently  Other Topics Concern   Not on file  Social History Narrative   Not on file   Social Determinants of Health   Financial Resource Strain: Not on file  Food Insecurity: Not on file  Transportation Needs: Not on file  Physical Activity: Not on file  Stress: Not on file  Social Connections: Not on file  Intimate Partner Violence: Not on file     Family History  Problem Relation Age of Onset   Breast cancer Mother    Heart disease Father    Aneurysm Father    Breast cancer Maternal Aunt      Current Outpatient Medications:    acetaminophen (TYLENOL) 500 MG tablet, Take 500-1,000 mg by mouth every 6 (six) hours as needed for mild pain or moderate pain., Disp: , Rfl:    ascorbic acid (VITAMIN C) 1000 MG tablet, Take 1,000 mg by mouth daily., Disp: , Rfl:    Cholecalciferol (SM VITAMIN D3) 100 MCG (4000 UT) CAPS, Take 4,000 Units by mouth daily., Disp: , Rfl:    citalopram (CELEXA) 10 MG tablet, Take 1 tablet by mouth daily., Disp: , Rfl:    donepezil (ARICEPT) 5 MG tablet, Take 1 tablet by mouth at bedtime., Disp: , Rfl:    dorzolamide (TRUSOPT) 2 % ophthalmic solution, Place 1 drop into both eyes every 12 (twelve) hours.,  Disp: 10 mL, Rfl:    dorzolamide-timolol (COSOPT) 22.3-6.8 MG/ML ophthalmic solution, Place 1 drop into both eyes 2 times daily at 12 noon and 4 pm., Disp: , Rfl:    Glucosamine-Chondroit-Vit C-Mn (GLUCOSAMINE CHONDROITIN COMPLX) CAPS, Take 1 tablet by mouth 2 (two) times daily., Disp: , Rfl:    latanoprost (XALATAN) 0.005 % ophthalmic solution, Place 1 drop into both eyes at bedtime., Disp: , Rfl:    magnesium oxide (MAG-OX) 400 MG tablet, Take 400 mg by mouth daily., Disp: , Rfl:    potassium chloride (KLOR-CON) 10 MEQ tablet, Take 1 tablet by mouth daily., Disp: , Rfl:    senna (SENOKOT) 8.6 MG TABS tablet, Take 1 tablet (8.6 mg total) by mouth daily., Disp: 120 tablet, Rfl:  0   letrozole (FEMARA) 2.5 MG tablet, Take 1 tablet (2.5 mg total) by mouth daily., Disp: 30 tablet, Rfl: 3   Physical exam:  Vitals:   07/04/21 1512  BP: 124/60  Pulse: (!) 49  Resp: 16  Temp: 98 F (36.7 C)  TempSrc: Tympanic  Weight: 139 lb (63 kg)  Height: _0  (1.6 m)   Physical Exam Constitutional:      Comments: Patient is sitting in a wheelchair.  Appears frail and fatigued.  HENT:     Head: Atraumatic.  Cardiovascular:     Rate and Rhythm: Normal rate and regular rhythm.     Heart sounds: Normal heart sounds.  Pulmonary:     Effort: Pulmonary effort is normal.     Breath sounds: Normal breath sounds.  Abdominal:     General: Bowel sounds are normal.     Palpations: Abdomen is soft.  Skin:    General: Skin is warm and dry.  Neurological:     Mental Status: She is alert and oriented to person, place, and time.    Chest wall exam: Patient is s/p bilateral mastectomy with a well-healed surgical scar and no evidence of chest wall recurrence    Assessment and plan- Patient is a 88 y.o. female with history of left breast cancer stage I in 2010 followed by stage IIb T2 N1 M0 invasive lobular carcinoma of the right breast in May 2022 s/p bilateral mastectomy and adjuvant radiation therapy.  She is  presently on Femara and here to establish oncology follow-up at [facility]  I have reviewed her prior breast cancer records from [facility].  With her history of recurrent breast cancer in May 2022 it would be reasonable to continue letrozole for at least 5 years if she can tolerate it along with calcium and vitamin D.  Ideally she needs another bone density scan at this time.  However I am concerned about her worsening dementia and she has an upcoming hospice informational visit as well.  Depending on her overall goals of care if she decides to pursue hospice, she can still continue Femara if covered by hospice or decide to stop it.  If she does not pursue hospice we could consider getting a bone density scan based on her overall performance status.  Since she has had bilateral mastectomy there would be no role for mammograms at this time.  I will tentatively see her back in 3 months with a bone density scan prior but if she transitions to hospice she would not require any further follow-up with me   Thank you for this kind referral and the opportunity to participate in the care of this  Patient   Visit Diagnosis 1. History of breast cancer     Dr. Randa Evens, MD, MPH [facility] at [facility] [phone] 07/06/2021

## 2021-07-25 ENCOUNTER — Emergency Department: Payer: Medicare Other

## 2021-07-25 ENCOUNTER — Inpatient Hospital Stay: Payer: Medicare Other

## 2021-07-25 ENCOUNTER — Inpatient Hospital Stay
Admission: EM | Admit: 2021-07-25 | Discharge: 2021-07-27 | DRG: 880 | Disposition: A | Discharge status: Hospice | Payer: Medicare Other | Attending: Hospitalist | Admitting: Hospitalist

## 2021-07-25 ENCOUNTER — Other Ambulatory Visit: Payer: Self-pay

## 2021-07-25 DIAGNOSIS — Z515 Encounter for palliative care: Secondary | ICD-10-CM

## 2021-07-25 DIAGNOSIS — F03918 Unspecified dementia, unspecified severity, with other behavioral disturbance: Secondary | ICD-10-CM

## 2021-07-25 DIAGNOSIS — I509 Heart failure, unspecified: Secondary | ICD-10-CM

## 2021-07-25 DIAGNOSIS — Z923 Personal history of irradiation: Secondary | ICD-10-CM

## 2021-07-25 DIAGNOSIS — Z79899 Other long term (current) drug therapy: Secondary | ICD-10-CM | POA: Diagnosis not present

## 2021-07-25 DIAGNOSIS — Z881 Allergy status to other antibiotic agents status: Secondary | ICD-10-CM

## 2021-07-25 DIAGNOSIS — R4781 Slurred speech: Secondary | ICD-10-CM | POA: Diagnosis present

## 2021-07-25 DIAGNOSIS — R1310 Dysphagia, unspecified: Secondary | ICD-10-CM | POA: Diagnosis present

## 2021-07-25 DIAGNOSIS — J9601 Acute respiratory failure with hypoxia: Secondary | ICD-10-CM | POA: Diagnosis present

## 2021-07-25 DIAGNOSIS — Z20822 Contact with and (suspected) exposure to covid-19: Secondary | ICD-10-CM | POA: Diagnosis present

## 2021-07-25 DIAGNOSIS — Z885 Allergy status to narcotic agent status: Secondary | ICD-10-CM

## 2021-07-25 DIAGNOSIS — Z853 Personal history of malignant neoplasm of breast: Secondary | ICD-10-CM

## 2021-07-25 DIAGNOSIS — F01511 Vascular dementia, unspecified severity, with agitation: Secondary | ICD-10-CM | POA: Diagnosis present

## 2021-07-25 DIAGNOSIS — C50919 Malignant neoplasm of unspecified site of unspecified female breast: Secondary | ICD-10-CM | POA: Diagnosis present

## 2021-07-25 DIAGNOSIS — F05 Delirium due to known physiological condition: Principal | ICD-10-CM | POA: Diagnosis present

## 2021-07-25 DIAGNOSIS — Z8249 Family history of ischemic heart disease and other diseases of the circulatory system: Secondary | ICD-10-CM

## 2021-07-25 DIAGNOSIS — R410 Disorientation, unspecified: Secondary | ICD-10-CM | POA: Diagnosis not present

## 2021-07-25 DIAGNOSIS — M8580 Other specified disorders of bone density and structure, unspecified site: Secondary | ICD-10-CM | POA: Diagnosis present

## 2021-07-25 DIAGNOSIS — Z66 Do not resuscitate: Secondary | ICD-10-CM | POA: Diagnosis present

## 2021-07-25 DIAGNOSIS — F01518 Vascular dementia, unspecified severity, with other behavioral disturbance: Secondary | ICD-10-CM | POA: Diagnosis present

## 2021-07-25 DIAGNOSIS — F0152 Vascular dementia, unspecified severity, with psychotic disturbance: Secondary | ICD-10-CM | POA: Diagnosis present

## 2021-07-25 DIAGNOSIS — Z79811 Long term (current) use of aromatase inhibitors: Secondary | ICD-10-CM

## 2021-07-25 DIAGNOSIS — I5033 Acute on chronic diastolic (congestive) heart failure: Secondary | ICD-10-CM | POA: Diagnosis present

## 2021-07-25 DIAGNOSIS — E785 Hyperlipidemia, unspecified: Secondary | ICD-10-CM | POA: Diagnosis present

## 2021-07-25 DIAGNOSIS — Z803 Family history of malignant neoplasm of breast: Secondary | ICD-10-CM | POA: Diagnosis not present

## 2021-07-25 DIAGNOSIS — R4182 Altered mental status, unspecified: Secondary | ICD-10-CM | POA: Diagnosis present

## 2021-07-25 DIAGNOSIS — I110 Hypertensive heart disease with heart failure: Secondary | ICD-10-CM | POA: Diagnosis present

## 2021-07-25 DIAGNOSIS — R0902 Hypoxemia: Secondary | ICD-10-CM | POA: Diagnosis present

## 2021-07-25 DIAGNOSIS — R531 Weakness: Secondary | ICD-10-CM | POA: Diagnosis present

## 2021-07-25 DIAGNOSIS — R0602 Shortness of breath: Secondary | ICD-10-CM | POA: Diagnosis not present

## 2021-07-25 DIAGNOSIS — Z9012 Acquired absence of left breast and nipple: Secondary | ICD-10-CM

## 2021-07-25 DIAGNOSIS — E876 Hypokalemia: Secondary | ICD-10-CM | POA: Diagnosis present

## 2021-07-25 DIAGNOSIS — Z7189 Other specified counseling: Secondary | ICD-10-CM | POA: Diagnosis not present

## 2021-07-25 DIAGNOSIS — Z888 Allergy status to other drugs, medicaments and biological substances status: Secondary | ICD-10-CM

## 2021-07-25 LAB — URINALYSIS, COMPLETE (UACMP) WITH MICROSCOPIC
Bilirubin Urine: NEGATIVE
Glucose, UA: NEGATIVE mg/dL
Hgb urine dipstick: NEGATIVE
Ketones, ur: NEGATIVE mg/dL
Leukocytes,Ua: NEGATIVE
Nitrite: NEGATIVE
Protein, ur: NEGATIVE mg/dL
Specific Gravity, Urine: 1.006 (ref 1.005–1.030)
Squamous Epithelial / HPF: NONE SEEN (ref 0–5)
WBC, UA: NONE SEEN WBC/hpf (ref 0–5)
pH: 6 (ref 5.0–8.0)

## 2021-07-25 LAB — COMPREHENSIVE METABOLIC PANEL
ALT: 14 U/L (ref 0–44)
AST: 23 U/L (ref 15–41)
Albumin: 4 g/dL (ref 3.5–5.0)
Alkaline Phosphatase: 65 U/L (ref 38–126)
Anion gap: 9 (ref 5–15)
BUN: 13 mg/dL (ref 8–23)
CO2: 28 mmol/L (ref 22–32)
Calcium: 9.6 mg/dL (ref 8.9–10.3)
Chloride: 92 mmol/L — ABNORMAL LOW (ref 98–111)
Creatinine, Ser: 0.76 mg/dL (ref 0.44–1.00)
GFR, Estimated: >60 mL/min (ref >60)
Glucose, Bld: 120 mg/dL — ABNORMAL HIGH (ref 70–99)
Potassium: 3.7 mmol/L (ref 3.5–5.1)
Sodium: 129 mmol/L — ABNORMAL LOW (ref 135–145)
Total Bilirubin: 0.8 mg/dL (ref 0.3–1.2)
Total Protein: 7.4 g/dL (ref 6.5–8.1)

## 2021-07-25 LAB — RESP PANEL BY RT-PCR (FLU A&B, COVID) ARPGX2
Influenza A by PCR: NEGATIVE
Influenza B by PCR: NEGATIVE
SARS Coronavirus 2 by RT PCR: NEGATIVE

## 2021-07-25 LAB — BLOOD GAS, ARTERIAL
Acid-Base Excess: 0.7 mmol/L (ref 0.0–2.0)
Bicarbonate: 24.3 mmol/L (ref 20.0–28.0)
O2 Saturation: 99.2 %
Patient temperature: 37
pCO2 arterial: 35 mmHg (ref 32–48)
pH, Arterial: 7.45 (ref 7.35–7.45)
pO2, Arterial: 85 mmHg (ref 83–108)

## 2021-07-25 LAB — CBC
HCT: 43.9 % (ref 36.0–46.0)
Hemoglobin: 14.5 g/dL (ref 12.0–15.0)
MCH: 30.1 pg (ref 26.0–34.0)
MCHC: 33 g/dL (ref 30.0–36.0)
MCV: 91.1 fL (ref 80.0–100.0)
Platelets: 275 10*3/uL (ref 150–400)
RBC: 4.82 MIL/uL (ref 3.87–5.11)
RDW: 14.6 % (ref 11.5–15.5)
WBC: 6.8 10*3/uL (ref 4.0–10.5)
nRBC: 0 % (ref 0.0–0.2)

## 2021-07-25 LAB — TROPONIN I (HIGH SENSITIVITY)
Troponin I (High Sensitivity): 38 ng/L — ABNORMAL HIGH (ref <18)
Troponin I (High Sensitivity): 39 ng/L — ABNORMAL HIGH (ref <18)

## 2021-07-25 LAB — BRAIN NATRIURETIC PEPTIDE: B Natriuretic Peptide: 196.1 pg/mL — ABNORMAL HIGH (ref 0.0–100.0)

## 2021-07-25 LAB — CBG MONITORING, ED: Glucose-Capillary: 120 mg/dL — ABNORMAL HIGH (ref 70–99)

## 2021-07-25 MED ORDER — SENNA 8.6 MG PO TABS
1.0000 | ORAL_TABLET | Freq: Every day | ORAL | Status: DC
  Administered 2021-07-27: 8.6 mg via ORAL
  Filled 2021-07-25 (×3): qty 1

## 2021-07-25 MED ORDER — FUROSEMIDE 10 MG/ML IJ SOLN
60.0000 mg | Freq: Once | INTRAMUSCULAR | Status: AC
  Administered 2021-07-25: 60 mg via INTRAVENOUS
  Filled 2021-07-25: qty 6

## 2021-07-25 MED ORDER — ACETAMINOPHEN 500 MG PO TABS: 500.0000 mg | ORAL_TABLET | Freq: Four times a day (QID) | ORAL | Status: DC | PRN

## 2021-07-25 MED ORDER — HALOPERIDOL LACTATE 5 MG/ML IJ SOLN
4.0000 mg | Freq: Once | INTRAMUSCULAR | Status: AC
  Administered 2021-07-25: 4 mg via INTRAVENOUS
  Filled 2021-07-25: qty 1

## 2021-07-25 MED ORDER — SODIUM CHLORIDE 0.9 % IV SOLN: 250.0000 mL | INTRAVENOUS | Status: DC | PRN

## 2021-07-25 MED ORDER — ONDANSETRON HCL 4 MG/2ML IJ SOLN: 4.0000 mg | Freq: Four times a day (QID) | INTRAMUSCULAR | Status: DC | PRN

## 2021-07-25 MED ORDER — FUROSEMIDE 40 MG PO TABS: 20.0000 mg | ORAL_TABLET | Freq: Every day | ORAL | Status: DC

## 2021-07-25 MED ORDER — AMLODIPINE BESYLATE 5 MG PO TABS
5.0000 mg | ORAL_TABLET | Freq: Every day | ORAL | Status: DC
  Administered 2021-07-26: 5 mg via ORAL
  Filled 2021-07-25: qty 1

## 2021-07-25 MED ORDER — LETROZOLE 2.5 MG PO TABS
2.5000 mg | ORAL_TABLET | Freq: Every day | ORAL | Status: DC
  Administered 2021-07-25 – 2021-07-27 (×3): 2.5 mg via ORAL
  Filled 2021-07-25 (×4): qty 1

## 2021-07-25 MED ORDER — HALOPERIDOL LACTATE 5 MG/ML IJ SOLN: 1.0000 mg | Freq: Four times a day (QID) | INTRAMUSCULAR | Status: DC | PRN

## 2021-07-25 MED ORDER — CITALOPRAM HYDROBROMIDE 20 MG PO TABS
10.0000 mg | ORAL_TABLET | Freq: Every day | ORAL | Status: DC
  Administered 2021-07-26 – 2021-07-27 (×2): 10 mg via ORAL
  Filled 2021-07-25 (×2): qty 1

## 2021-07-25 MED ORDER — ENOXAPARIN SODIUM 40 MG/0.4ML IJ SOSY
40.0000 mg | PREFILLED_SYRINGE | INTRAMUSCULAR | Status: DC
  Administered 2021-07-25: 40 mg via SUBCUTANEOUS
  Filled 2021-07-25: qty 0.4

## 2021-07-25 MED ORDER — DORZOLAMIDE HCL-TIMOLOL MAL 2-0.5 % OP SOLN
1.0000 [drp] | Freq: Two times a day (BID) | OPHTHALMIC | Status: DC
  Administered 2021-07-26 (×2): 1 [drp] via OPHTHALMIC
  Filled 2021-07-25: qty 10

## 2021-07-25 MED ORDER — DONEPEZIL HCL 5 MG PO TABS
5.0000 mg | ORAL_TABLET | Freq: Every day | ORAL | Status: DC
  Administered 2021-07-25 – 2021-07-26 (×2): 5 mg via ORAL
  Filled 2021-07-25 (×2): qty 1

## 2021-07-25 MED ORDER — MAGNESIUM OXIDE 400 MG PO TABS: 400.0000 mg | ORAL_TABLET | Freq: Every day | ORAL | Status: DC

## 2021-07-25 MED ORDER — SODIUM CHLORIDE 0.9% FLUSH: 3.0000 mL | INTRAVENOUS | Status: DC | PRN

## 2021-07-25 MED ORDER — SODIUM CHLORIDE 0.9 % IV BOLUS
1000.0000 mL | Freq: Once | INTRAVENOUS | Status: AC
  Administered 2021-07-25: 1000 mL via INTRAVENOUS

## 2021-07-25 MED ORDER — ONDANSETRON HCL 4 MG PO TABS: 4.0000 mg | ORAL_TABLET | Freq: Four times a day (QID) | ORAL | Status: DC | PRN

## 2021-07-25 MED ORDER — SODIUM CHLORIDE 0.9% FLUSH
3.0000 mL | Freq: Two times a day (BID) | INTRAVENOUS | Status: DC
  Administered 2021-07-25 – 2021-07-27 (×4): 3 mL via INTRAVENOUS

## 2021-07-25 MED ORDER — LATANOPROST 0.005 % OP SOLN
1.0000 [drp] | Freq: Every day | OPHTHALMIC | Status: DC
  Administered 2021-07-25 – 2021-07-26 (×2): 1 [drp] via OPHTHALMIC
  Filled 2021-07-25: qty 2.5

## 2021-07-25 NOTE — ED Triage Notes (Addendum)
Pt to ED via [facility] from home. Pt is a hospice patient x2 weeks. Pt diagnosed with pneumonia and placed on Augmentin on Saturday. Pt has vascular dementia but family reports pt is more altered than normal. Family reports this morning oxygen dropped to 88-89%. Pt wears 2L [place] that started today. CBG 113 and all other VS WNL. Family wants pt evaluated for AMS and wants a chest xray due to hypoxia.  ?

## 2021-07-25 NOTE — ED Notes (Signed)
Pt transported to xray. Will obtain CBG upon return  ?

## 2021-07-25 NOTE — H&P (Signed)
?History and Physical  ? ? ?PatientMaddi Nunez ATF:[id number] DOB: 1931-07-10 ?DOA: 07/25/2021 ?DOS: the patient was seen and examined on 07/25/2021 ?PCP: Kirk Ruths, MD  ?Patient coming from: Home ? ?Chief Complaint:  ?Chief Complaint  ?Patient presents with  ? Altered Mental Status  ? Shortness of Breath  ? ?HPI: Suzanne Nunez is a 88 y.o. female with medical history significant for dementia, hypertension who was brought into the ER by her granddaughter who is her caregiver for evaluation of mental status changes which she describes as visual hallucinations and aggressive behavior. ?Patient is currently on hospice and according to her granddaughter was recently started on Augmentin for suspected pneumonia.  On the day of admission her granddaughter noted that she had become increasingly agitated and have room air pulse oximetry was about 88% and so the hospice nurse placed on 2 L of oxygen. ?She brought her into the ER for evaluation of change in mental status. ?She states that patient has had aggressive behavior in the past but not as bad as it is today. ?She notes that patient occasionally has difficulty swallowing and they have tried to modify her meals at home. ?I am unable to do a review of systems on this patient. ?She had a chest x-ray in the ER which showed trace pleural effusions and received a dose of Lasix. ?Review of Systems: unable to review all systems due to the inability of the patient to answer questions. ?Past Medical History:  ?Diagnosis Date  ? Anemia   ? Arthritis   ? BRCA negative   ? Breast cancer ([place]) 09/2020  ? right breast  ? Breast cancer ([place]) 10/13/2008  ? Carpal tunnel syndrome   ? Dementia arising in the senium and presenium ([place])   ? Dyslipidemia   ? Glaucoma   ? Hypertension   ? Neuropathy   ? Osteopenia after menopause   ? Personal history of radiation therapy 2010  ? after breast cancer  ? ?Past Surgical History:  ?Procedure Laterality Date  ? BLADDER SURGERY   2017  ? CARDIAC CATHETERIZATION  2003  ? CATARACT EXTRACTION    ? cyctocele repair  06/07/2020  ? MASTECTOMY Right 09/16/2020  ? MASTECTOMY Left   ? 2010  ? TRIGGER FINGER RELEASE N/A   ? VAGINAL PROLAPSE REPAIR  06/07/2020  ? ?Social History:  reports that she has never smoked. She has never used smokeless tobacco. She reports that she does not currently use drugs. She reports that she does not drink alcohol. ? ?Allergies  ?Allergen Reactions  ? Ace Inhibitors Swelling  ?  Other reaction(s): Angioedema ?  ? Cortisone Other (See Comments)  ?   ?Increased blood pressure & heart rate ?Increased blood pressure & heart rate ?  ? Codeine Other (See Comments)  ?  Chest pain ?Chest pain ?  ? Dextromethorphan-Guaifenesin Other (See Comments)  ?   ? CHEST PAIN. ?Other Reaction: CHEST PAIN. ?Other reaction(s): Other (See Comments) ?Other Reaction: CHEST PAIN. ?  ? Other   ?  Son reports whole system is affected and pt is sensitive to everything;  Memory is still delayed from anesthesia 3 months ago  ? ? ?Family History  ?Problem Relation Age of Onset  ? Breast cancer Mother   ? Heart disease Father   ? Aneurysm Father   ? Breast cancer Maternal Aunt   ? ? ?Prior to Admission medications   ?Medication Sig Start Date End Date Taking? Authorizing Provider  ?acetaminophen (TYLENOL) 500 MG  tablet Take 500-1,000 mg by mouth every 6 (six) hours as needed for mild pain or moderate pain.    [provider]  ?ascorbic acid (VITAMIN C) 1000 MG tablet Take 1,000 mg by mouth daily.    [provider]  ?Cholecalciferol (SM VITAMIN D3) 100 MCG (4000 UT) CAPS Take 4,000 Units by mouth daily.    [provider]  ?citalopram (CELEXA) 10 MG tablet Take 1 tablet by mouth daily. 05/22/21   [provider]  ?donepezil (ARICEPT) 5 MG tablet Take 1 tablet by mouth at bedtime. 05/22/21 05/22/22  [provider]  ?dorzolamide (TRUSOPT) 2 % ophthalmic solution Place 1 drop into both eyes every 12 (twelve) hours.  04/19/21   Richarda Osmond, MD  ?dorzolamide-timolol (COSOPT) 22.3-6.8 MG/ML ophthalmic solution Place 1 drop into both eyes 2 times daily at 12 noon and 4 pm.    [provider]  ?Glucosamine-Chondroit-Vit C-Mn (GLUCOSAMINE CHONDROITIN COMPLX) CAPS Take 1 tablet by mouth 2 (two) times daily.    [provider]  ?latanoprost (XALATAN) 0.005 % ophthalmic solution Place 1 drop into both eyes at bedtime.    [provider]  ?letrozole (FEMARA) 2.5 MG tablet Take 1 tablet (2.5 mg total) by mouth daily. 07/04/21   Sindy Guadeloupe, MD  ?magnesium oxide (MAG-OX) 400 MG tablet Take 400 mg by mouth daily.    [provider]  ?potassium chloride (KLOR-CON) 10 MEQ tablet Take 1 tablet by mouth daily. 05/22/21 05/22/22  [provider]  ?senna (SENOKOT) 8.6 MG TABS tablet Take 1 tablet (8.6 mg total) by mouth daily. 04/19/21   Richarda Osmond, MD  ? ? ?Physical Exam: ?Vitals:  ? 07/25/21 1433 07/25/21 1445 07/25/21 1716 07/25/21 1803  ?BP: (!) 165/70  (!) 168/87 (!) 178/63  ?Pulse: 73 73 93 81  ?Resp: '13 19 18 20  ' ?Temp: 97.9 ?F (36.6 ?C)   97.9 ?F (36.6 ?C)  ?TempSrc: Oral   Oral  ?SpO2: 100% 98% 98% 98%  ?Weight: 63 kg     ?Height: '5\' 3"'  (1.6 m)     ? ?Physical Exam ?Vitals and nursing note reviewed.  ?Constitutional:   ?   Appearance: She is well-developed.  ?HENT:  ?   Head: Normocephalic and atraumatic.  ?   Mouth/Throat:  ?   Mouth: Mucous membranes are moist.  ?Eyes:  ?   Pupils: Pupils are equal, round, and reactive to light.  ?Cardiovascular:  ?   Rate and Rhythm: Normal rate and regular rhythm.  ?Pulmonary:  ?   Effort: Pulmonary effort is normal.  ?   Breath sounds: Examination of the right-lower field reveals rales. Examination of the left-lower field reveals rales. Rales present.  ?Abdominal:  ?   General: Bowel sounds are normal.  ?   Palpations: Abdomen is soft.  ?Musculoskeletal:  ?   Cervical back: Normal range of motion.  ?Skin: ?   General: Skin is warm and dry.   ?Neurological:  ?   General: No focal deficit present.  ?   Mental Status: She is alert.  ?   Comments: Oriented only to person  ?Psychiatric:     ?   Behavior: Behavior is agitated.  ? ? ? ? ?Data Reviewed: ?Relevant notes from primary care and specialist visits, past discharge summaries as available in EHR, including Care Everywhere. ?Prior diagnostic testing as pertinent to current admission diagnoses ?Updated medications and problem lists for reconciliation ?ED course, including vitals, labs, imaging, treatment and response  to treatment ?Triage notes, nursing and pharmacy notes and ED provider's notes ?Notable results as noted in HPI ?Labs reviewed.  BNP 196, sodium 129, chloride 92, glucose 120, white count 6.8, urine analysis is sterile ?Chest x-ray reviewed by me shows bilateral pleural effusions ?CT scan of the head without contrast shows no acute intracranial abnormality. ?Moderately advanced chronic microvascular ischemic white matter ?disease. ?Twelve-lead EKG reviewed by me shows sinus rhythm with an incomplete right bundle branch block.  LVH ?There are no new results to review at this time. ? ?Assessment and Plan: ?* AMS (altered mental status) ?Unclear etiology.  Rule out possible delirium ?Follow-up results of MRI of the brain ?We will place patient on Haldol as needed for agitation ? ?CHF (congestive heart failure) ([place]) ?Acute diastolic dysfunction CHF ?Patient's last 2D echocardiogram from 07/22 shows an LVEF of 50 to 55% ?Optimize blood pressure control ? ?Hypoxia ?Patient noted to have room air pulse oximetry between 88 and 89% that improved following oxygen supplementation at 2 L ?Chest x-ray suggestive of bilateral pleural effusions ?Patient received a dose of Lasix 60 mg IV in the ER ?Follow-up results of ABG ? ?Breast cancer ([place]) ?Continue letrozole ? ? ? ? ? Advance Care Planning:   Code Status: DNR  ? ?Consults: Palliative care ? ?Family Communication: Greater than 50% of time was spent  discussing plan of care with patient's granddaughter at the bedside.  All questions and concerns have been addressed. ? ?Severity of Illness: ?The appropriate patient status for this patient is INPATIENT.

## 2021-07-25 NOTE — Assessment & Plan Note (Addendum)
--  likely delirium on top of baseline dementia. ?--MRI brain neg for acute finding. ?

## 2021-07-25 NOTE — Assessment & Plan Note (Addendum)
--  cont IV lasix for diuresis ?

## 2021-07-25 NOTE — ED Provider Notes (Signed)
? ?[facility] ?Provider Note ? ? ? Event Date/Time  ? First MD Initiated Contact with Patient 07/25/21 1513   ?  (approximate) ? ?History  ? ?Chief Complaint: Altered Mental Status and Shortness of Breath ? ?HPI ? ?Kindred Heying is a 88 y.o. female with a past medical history of vascular dementia, hypertension, presents to the emergency department for altered mental status.  According to the granddaughter who is the patient's primary caregiver, patient has vascular dementia but normally is able to walk and is interactive.  She states over the past 2 to 3 days the patient has been very weak unable to walk and had slurred speech at times.  Patient is on hospice care and recently hospice nurse started her on oxygen which actually just started today.  They noted that her O2 saturation would drop at times into the upper 80s and the patient had a wet sounding cough.  PCP started the patient on antibiotics for presumed pneumonia but never had a chest x-ray performed per granddaughter.  No fever. ? ?Physical Exam  ? ?Triage Vital Signs: ?ED Triage Vitals  ?Enc Vitals Group  ?   BP 07/25/21 1430 (!) 160/85  ?   Pulse Rate 07/25/21 1430 74  ?   Resp 07/25/21 1433 13  ?   Temp 07/25/21 1433 97.9 ?F (36.6 ?C)  ?   Temp Source 07/25/21 1433 Oral  ?   SpO2 07/25/21 1430 100 %  ?   Weight 07/25/21 1433 139 lb (63 kg)  ?   Height 07/25/21 1433 '5\' 3"'$  (1.6 m)  ?   Head Circumference --   ?   Peak Flow --   ?   Pain Score 07/25/21 1432 0  ?   Pain Loc --   ?   Pain Edu? --   ?   Excl. in [place]? --   ? ? ?Most recent vital signs: ?Vitals:  ? 07/25/21 1433 07/25/21 1445  ?BP: (!) 165/70   ?Pulse: 73 73  ?Resp: 13 19  ?Temp: 97.9 ?F (36.6 ?C)   ?SpO2: 100% 98%  ? ? ?General: Awake, no distress.  ?CV:  Good peripheral perfusion.  Regular rate and rhythm  ?Resp:  Normal effort.  Equal breath sounds bilaterally.  ?Abd:  No distention.  Soft, nontender.  No rebound or guarding. ?Other:  Patient is calm cooperative and  pleasant.  No distress.  No complaints. ? ? ?ED Results / Procedures / Treatments  ? ?EKG ? ?EKG viewed and interpreted by myself shows a normal sinus rhythm at 72 bpm with a narrow QRS, left axis deviation, largely normal intervals with nonspecific but no concerning ST changes. ? ?RADIOLOGY ? ?I personally reviewed the chest x-ray images appears to be somewhat hazy in the lower lung fields consider atelectasis versus interstitial edema. ?Radiology is read the chest x-ray is possible trace pleural effusions.  No consolidation. ? ? ?MEDICATIONS ORDERED IN ED: ?Medications  ?sodium chloride 0.9 % bolus 1,000 mL (1,000 mLs Intravenous New Bag/Given 07/25/21 1532)  ? ? ? ?IMPRESSION / MDM / ASSESSMENT AND PLAN / ED COURSE  ?I reviewed the triage vital signs and the nursing notes. ? ?Patient presents to the emergency department for altered mental status.  Patient has a history of vascular dementia daughter states speech is somewhat more slurred than typical.  They are concerned the patient could have pneumonia given recent cough and they have been treating with antibiotics but no fever.  We will check  labs, chest x-ray, EKG and obtaining CT scan of the head.  Differential would include infectious etiologies such as UTI or pneumonia, COVID, metabolic or electrolyte abnormality. ? ?Patient's labs have resulted showing mild hyponatremia however largely unchanged from baseline.  Patient CBC is normal.  Chest x-ray does not appear to show any significant consolidation.  Remainder the chemistry is nonrevealing.  I have added on cardiac enzymes.  CT scan is pending.  COVID pending, urinalysis pending ? ?CT does not appear to show any acute abnormality.  COVID and flu negative.  Urinalysis is normal.  Troponin slightly elevated we will repeat at 2 hours.  Patient continues to be confused, attempting to pull her IV out and electrodes off her chest.  Is picking at things refusing to wear oxygen.  Granddaughter states this is all  new for the patient 2 days ago she was calm cooperative with no issues.  Concern for likely worsening vascular dementia versus delirium.  Given the acute delirium we will admit to the hospital service for further work-up.  We will proceed with MR imaging of the brain to rule out acute CVA. ? ?FINAL CLINICAL IMPRESSION(S) / ED DIAGNOSES  ? ?Altered mental status ?Confusion ? ? ?Note:  This document was prepared using Dragon voice recognition software and may include unintentional dictation errors. ?  ?Harvest Dark, MD ?07/25/21 1721 ? ?

## 2021-07-25 NOTE — Assessment & Plan Note (Signed)
- 

## 2021-07-25 NOTE — Assessment & Plan Note (Addendum)
Patient noted to have room air pulse oximetry between 88 and 89% that improved following oxygen supplementation at 2 L ?Chest x-ray suggestive of bilateral pleural effusions. ?Patient received a dose of Lasix 60 mg IV in the ER ?Plan: ?--cont IV lasix for diuresis today ?

## 2021-07-26 DIAGNOSIS — J9601 Acute respiratory failure with hypoxia: Secondary | ICD-10-CM

## 2021-07-26 DIAGNOSIS — I5033 Acute on chronic diastolic (congestive) heart failure: Secondary | ICD-10-CM

## 2021-07-26 DIAGNOSIS — Z515 Encounter for palliative care: Secondary | ICD-10-CM

## 2021-07-26 DIAGNOSIS — F05 Delirium due to known physiological condition: Secondary | ICD-10-CM

## 2021-07-26 DIAGNOSIS — Z7189 Other specified counseling: Secondary | ICD-10-CM

## 2021-07-26 DIAGNOSIS — C50919 Malignant neoplasm of unspecified site of unspecified female breast: Secondary | ICD-10-CM

## 2021-07-26 DIAGNOSIS — R4182 Altered mental status, unspecified: Secondary | ICD-10-CM | POA: Diagnosis not present

## 2021-07-26 DIAGNOSIS — F03918 Unspecified dementia, unspecified severity, with other behavioral disturbance: Secondary | ICD-10-CM

## 2021-07-26 DIAGNOSIS — Z66 Do not resuscitate: Secondary | ICD-10-CM

## 2021-07-26 LAB — BASIC METABOLIC PANEL
Anion gap: 8 (ref 5–15)
BUN: 11 mg/dL (ref 8–23)
CO2: 29 mmol/L (ref 22–32)
Calcium: 9.5 mg/dL (ref 8.9–10.3)
Chloride: 99 mmol/L (ref 98–111)
Creatinine, Ser: 0.81 mg/dL (ref 0.44–1.00)
GFR, Estimated: >60 mL/min (ref >60)
Glucose, Bld: 115 mg/dL — ABNORMAL HIGH (ref 70–99)
Potassium: 3 mmol/L — ABNORMAL LOW (ref 3.5–5.1)
Sodium: 136 mmol/L (ref 135–145)

## 2021-07-26 LAB — CBC
HCT: 40.4 % (ref 36.0–46.0)
Hemoglobin: 13.5 g/dL (ref 12.0–15.0)
MCH: 30.5 pg (ref 26.0–34.0)
MCHC: 33.4 g/dL (ref 30.0–36.0)
MCV: 91.2 fL (ref 80.0–100.0)
Platelets: 274 10*3/uL (ref 150–400)
RBC: 4.43 MIL/uL (ref 3.87–5.11)
RDW: 14.6 % (ref 11.5–15.5)
WBC: 6.9 10*3/uL (ref 4.0–10.5)
nRBC: 0 % (ref 0.0–0.2)

## 2021-07-26 MED ORDER — QUETIAPINE FUMARATE 200 MG PO TABS
200.0000 mg | ORAL_TABLET | Freq: Every day | ORAL | Status: DC
  Filled 2021-07-26: qty 1

## 2021-07-26 MED ORDER — ACETAMINOPHEN 500 MG PO TABS
500.0000 mg | ORAL_TABLET | Freq: Four times a day (QID) | ORAL | Status: DC
  Administered 2021-07-26: 500 mg via ORAL
  Filled 2021-07-26: qty 1

## 2021-07-26 MED ORDER — ACETAMINOPHEN 500 MG PO TABS
1000.0000 mg | ORAL_TABLET | Freq: Four times a day (QID) | ORAL | Status: DC
  Administered 2021-07-27: 1000 mg via ORAL
  Filled 2021-07-26 (×2): qty 2

## 2021-07-26 MED ORDER — POLYVINYL ALCOHOL 1.4 % OP SOLN
1.0000 [drp] | Freq: Four times a day (QID) | OPHTHALMIC | Status: DC | PRN
  Filled 2021-07-26: qty 15

## 2021-07-26 MED ORDER — FUROSEMIDE 10 MG/ML IJ SOLN
40.0000 mg | Freq: Once | INTRAMUSCULAR | Status: AC
  Administered 2021-07-26: 40 mg via INTRAVENOUS
  Filled 2021-07-26: qty 4

## 2021-07-26 MED ORDER — DIPHENHYDRAMINE HCL 50 MG/ML IJ SOLN
25.0000 mg | Freq: Three times a day (TID) | INTRAMUSCULAR | Status: DC | PRN
  Administered 2021-07-26: 25 mg via INTRAVENOUS
  Filled 2021-07-26: qty 1

## 2021-07-26 MED ORDER — GLYCOPYRROLATE 0.2 MG/ML IJ SOLN: 0.2000 mg | INTRAMUSCULAR | Status: DC | PRN

## 2021-07-26 MED ORDER — GLYCOPYRROLATE 1 MG PO TABS
1.0000 mg | ORAL_TABLET | ORAL | Status: DC | PRN
  Filled 2021-07-26: qty 1

## 2021-07-26 MED ORDER — BIOTENE DRY MOUTH MT LIQD: 15.0000 mL | OROMUCOSAL | Status: DC | PRN

## 2021-07-26 MED ORDER — MORPHINE SULFATE (PF) 2 MG/ML IV SOLN
0.5000 mg | INTRAVENOUS | Status: DC | PRN
  Administered 2021-07-26 (×2): 0.5 mg via INTRAVENOUS
  Filled 2021-07-26 (×2): qty 1

## 2021-07-26 MED ORDER — ACETAMINOPHEN 500 MG PO TABS: 1000.0000 mg | ORAL_TABLET | Freq: Four times a day (QID) | ORAL | Status: DC

## 2021-07-26 MED ORDER — QUETIAPINE FUMARATE 25 MG PO TABS
200.0000 mg | ORAL_TABLET | Freq: Every day | ORAL | Status: DC
  Administered 2021-07-26: 200 mg via ORAL
  Filled 2021-07-26: qty 8

## 2021-07-26 MED ORDER — POTASSIUM CHLORIDE 20 MEQ PO PACK
40.0000 meq | PACK | Freq: Once | ORAL | Status: AC
Start: 2021-07-26 — End: 2021-07-26
  Administered 2021-07-26: 40 meq via ORAL
  Filled 2021-07-26: qty 2

## 2021-07-26 NOTE — Consult Note (Signed)
? ?                                                                                ?Consultation Note ?Date: 07/26/2021  ? ?Patient Name: Suzanne Nunez  ?DOB: November 02, 1931  MRN: [id number]  Age / Sex: 88 y.o., female  ?PCP: Kirk Ruths, MD ?Referring Physician: Enzo Bi, MD ? ?Reason for Consultation: Establishing goals of care ? ?HPI/Patient Profile: 88 y.o. female  with past medical history of vascular dementia, breast cancer, and HTN admitted on 07/25/2021 with altered mental status with visual hallucinations and aggressive behaviors.  Patient's granddaughter Suzanne Nunez is POA.  Lauren shares patient was enrolled in hospice approximately 2 weeks ago.  Hospice [facility] suspected pneumonia and started patient on Augmentin.  Mental status continued to decline and warrant brought patient to ER.  ? ?Clinical Assessment and Goals of Care: ?I have reviewed medical records including EPIC notes, labs and imaging, assessed the patient and then met with patient, her granddaughter Suzanne Nunez, and Maureen's friend/patient's caregiver Kathlee Nations at bedside to discuss diagnosis prognosis, GOC, EOL wishes, disposition and options. ? ?I introduced Palliative Medicine as specialized medical care for people living with serious illness. It focuses on providing relief from the symptoms and stress of a serious illness. The goal is to improve quality of life for both the patient and the family. ? ?We discussed a brief life review of the patient.  Patient was married for 30 years to her husband and father of her 2 children.  Patient has children, grandchildren, and great-grandchildren.  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  She enjoyed gardening. ? ?As far as functional and nutritional status [place] and lives say patient was not steady on her feet and needed max assistance to transfer, stand, and walk to use the bathroom.  Lauren and Kathlee Nations endorsed patient was pleasantly confused and not combative prior to  admission. ? ?We discussed patient's current illness and what it means in the larger context of patient's on-going co-morbidities. I attempted to elicit values and goals of care important to the patient.  Lauren is clear that patient would want to remain a DNR, would not want to have any aggressive measures, and wants to be kept comfortable for a peaceful end-of-life.  Lauren would like for patient to return home with hospice if possible. ? ?Reviewed comfort care which Lauren shared she is very familiar with since she had already agreed to hospice and focusing on quality of life.  Reviewed unrestricted visitor access, liberalizing medications to aggressively manage symptoms such as pain, agitation, nausea, vomiting, and air hunger.  Reviewed that no more blood draws, lab tests, or procedures would be performed.  All medical efforts would be focused on compassionate, dignified care.  Lauren was clear that this is not how the patient would want to live and that addressing her pain and insomnia is the priority. ?  ? ?Morphine 0.20m IV given with this provider present. Pt appeared more relaxed, WOB decreased, and fidgeting/tremors decreased. WIll continue 0.527mQ2H PRN ? ?Discussed with patient/family the importance of continued conversation with family and the medical providers regarding overall plan of care and treatment  options, ensuring decisions are within the context of the patient?s values and GOCs.   ? ?Questions and concerns were addressed. The family was encouraged to call with questions or concerns.  ? ?Primary Decision Maker ?HCPOA ? ?Code Status/Advance Care Planning: ?DNR ?Unrestricted visitor access ?Vital signs taken at family's request only ?Discontinue of all medications and procedures not focused on comfort ?As needed morphine and Haldol for increased work of breathing, pain, and agitation ?Scheduled Tylenol-patient was receiving 1000 mg every 8 hours at home and family wishes to continue  this ?Seroquel 200 mg nightly for sleep ?Supplemental oxygen meant to be given only at 2 L ?Use of fan in the face if patient become short of breath ? ?Prognosis:   ?< 6 months ? ?Discharge Planning: Home with Hospice ? ?Primary Diagnoses: ?Present on Admission: ? AMS (altered mental status) ? Acute hypoxemic respiratory failure ([place]) ? Breast cancer ([place]) ? Dementia with behavioral disturbance ? Delirium due to multiple etiologies, acute, hyperactive ? ? ?Physical Exam ?Vitals and nursing note reviewed.  ?HENT:  ?   Head: Normocephalic and atraumatic.  ?Cardiovascular:  ?   Rate and Rhythm: Normal rate.  ?Pulmonary:  ?   Breath sounds: Examination of the right-middle field reveals rhonchi. Examination of the left-middle field reveals rhonchi. Rhonchi present.  ?Abdominal:  ?   Palpations: Abdomen is soft.  ?Musculoskeletal:  ?   Cervical back: Normal range of motion.  ?   Comments: Generalized weakness  ?Skin: ?   General: Skin is warm and dry.  ?Neurological:  ?   Mental Status: She is alert.  ?   Comments: Pleasantly confused  ?Psychiatric:     ?   Mood and Affect: Mood is not anxious.     ?   Behavior: Behavior is not agitated.  ? ? ?Palliative Assessment/Data: ? ? ? ? ?I discussed this patient's plan of care with POA/granddaughter Suzanne Nunez, caregiver/friend Kathlee Nations, RN Argentina Donovan, Dr. Billie Ruddy. ? ?Thank you for this consult. Palliative medicine will continue to follow and assist holistically.  ? ?Time Total: 90 minutes ?Greater than 50%  of this time was spent counseling and coordinating care related to the above assessment and plan. ? ?Signed by: ?Jordan Hawks, DNP, FNP-BC ?Palliative Medicine ? ?  ?Please contact Palliative Medicine Team phone at [phone] for questions and concerns.  ?For individual provider: See Amion ? ? ? ? ? ? ? ? ? ? ? ? ?  ?

## 2021-07-26 NOTE — Assessment & Plan Note (Signed)
--  Pt has been agitated, confused, hyperactive.  Family reported pt has not been sleeping much since prior to presentation, which can trigger or exacerbate delirium.  Pt also has baseline vascular dementia. ?--Per family, IV haldol 4 mg didn't have any affect on the pt. ?Plan: ?--start seroquel 200 mg nightly to help with sleep ?--IV benadryl PRN during the day for agitation ?--avoid benzos ?

## 2021-07-26 NOTE — Progress Notes (Signed)
?  Progress Note ? ? ?PatientOriyah Nunez DTO:[id number] DOB: 1931-10-09 DOA: 07/25/2021     1 ?DOS: the patient was seen and examined on 07/26/2021 ?  ?Brief hospital course: ?No notes on file ? ?Assessment and Plan: ?* Delirium due to multiple etiologies, acute, hyperactive ?--Pt has been agitated, confused, hyperactive.  Family reported pt has not been sleeping much since prior to presentation, which can trigger or exacerbate delirium.  Pt also has baseline vascular dementia. ?--Per family, IV haldol 4 mg didn't have any affect on the pt. ?Plan: ?--start seroquel 200 mg nightly to help with sleep ?--IV benadryl PRN during the day for agitation ?--avoid benzos ? ?AMS (altered mental status) ?--likely delirium on top of baseline dementia. ?--MRI brain neg for acute finding. ? ?Dementia with behavioral disturbance ?--cont donepezil and celexa ? ?Acute on chronic diastolic CHF (congestive heart failure) ([place]) ?--cont IV lasix for diuresis ? ?Acute hypoxemic respiratory failure ([place]) ?Patient noted to have room air pulse oximetry between 88 and 89% that improved following oxygen supplementation at 2 L ?Chest x-ray suggestive of bilateral pleural effusions. ?Patient received a dose of Lasix 60 mg IV in the ER ?Plan: ?--cont IV lasix for diuresis today ? ?Breast cancer ([place]) ?Continue letrozole ? ? ? ? ?  ? ?Subjective:  ?Pt remained confused.  Per family, pt hasn't slept much in many days.  Good oral intake though. ? ? ?Physical Exam: ?Constitutional: NAD, alert, not oriented, confused ?HEENT: conjunctivae and lids normal, EOMI ?CV: No cyanosis.   ?RESP: normal respiratory effort, on RA ?SKIN: warm, dry ?Neuro: II - XII grossly intact.   ? ? ?Data Reviewed: ? ?Family Communication: granddaughter updated at bedside today ? ?Disposition: ?Status is: Inpatient ?Remains inpatient appropriate because: IV lasix ? ? Planned Discharge Destination: Home ? ? ? ?Time spent: 50 minutes ? ?Author: ?Enzo Bi, MD ?07/26/2021 3:44  PM ? ?For on call review [email or web address].  ? ?

## 2021-07-26 NOTE — Assessment & Plan Note (Signed)
--  cont donepezil and celexa ?

## 2021-07-27 DIAGNOSIS — R4182 Altered mental status, unspecified: Secondary | ICD-10-CM

## 2021-07-27 DIAGNOSIS — C50919 Malignant neoplasm of unspecified site of unspecified female breast: Secondary | ICD-10-CM | POA: Diagnosis not present

## 2021-07-27 DIAGNOSIS — F05 Delirium due to known physiological condition: Principal | ICD-10-CM

## 2021-07-27 DIAGNOSIS — I5033 Acute on chronic diastolic (congestive) heart failure: Secondary | ICD-10-CM | POA: Diagnosis not present

## 2021-07-27 DIAGNOSIS — R0602 Shortness of breath: Secondary | ICD-10-CM

## 2021-07-27 DIAGNOSIS — F03918 Unspecified dementia, unspecified severity, with other behavioral disturbance: Secondary | ICD-10-CM

## 2021-07-27 DIAGNOSIS — Z66 Do not resuscitate: Secondary | ICD-10-CM

## 2021-07-27 DIAGNOSIS — Z515 Encounter for palliative care: Secondary | ICD-10-CM

## 2021-07-27 MED ORDER — QUETIAPINE FUMARATE 200 MG PO TABS: 200.0000 mg | ORAL_TABLET | Freq: Every evening | ORAL | 1 refills | Status: AC | PRN

## 2021-07-27 MED ORDER — AMLODIPINE BESYLATE 10 MG PO TABS: 10.0000 mg | ORAL_TABLET | Freq: Every day | ORAL | 2 refills | Status: AC

## 2021-07-27 MED ORDER — MORPHINE SULFATE 10 MG/5ML PO SOLN
5.0000 mg | ORAL | Status: DC | PRN
  Administered 2021-07-27: 5 mg via ORAL
  Filled 2021-07-27: qty 5

## 2021-07-27 MED ORDER — POTASSIUM CHLORIDE 20 MEQ PO PACK
40.0000 meq | PACK | Freq: Once | ORAL | Status: AC
  Administered 2021-07-27: 40 meq via ORAL
  Filled 2021-07-27: qty 2

## 2021-07-27 MED ORDER — FUROSEMIDE 10 MG/ML IJ SOLN
40.0000 mg | Freq: Once | INTRAMUSCULAR | Status: AC
  Administered 2021-07-27: 40 mg via INTRAVENOUS
  Filled 2021-07-27: qty 4

## 2021-07-27 MED ORDER — AMLODIPINE BESYLATE 10 MG PO TABS
10.0000 mg | ORAL_TABLET | Freq: Every day | ORAL | Status: DC
Start: 2021-07-27 — End: 2021-07-27
  Administered 2021-07-27: 10 mg via ORAL
  Filled 2021-07-27: qty 1

## 2021-07-27 NOTE — Progress Notes (Signed)
[facility] present for pt discharge; discharge packet with out of facility, DNR and EMS paperwork, previously put in the envelope by [facility] given to EMS personnel; pt discharged via stretcher home with hospice ?

## 2021-07-27 NOTE — Discharge Instructions (Signed)
[facility].

## 2021-07-27 NOTE — Discharge Summary (Signed)
? ?Physician Discharge Summary ? ? ?Suzanne Nunez  female DOB: [birth year]  ?BWI:[id number] ? ?PCP: Kirk Ruths, MD ? ?Admit date: 07/25/2021 ?Discharge date: 07/27/2021 ? ?Admitted From: home ?Disposition:  home with hospice ?Granddaughter updated at bedside prior to discharge. ? ?CODE STATUS: DNR ? ? ?Hospital Course:  ?For full details, please see H&P, progress notes, consult notes and ancillary notes.  ?Briefly,  ?Suzanne Nunez is a 88 y.o. female with medical history significant for dementia, hypertension who was brought into the ER by her granddaughter who is her caregiver for evaluation of mental status changes which she describes as visual hallucinations and aggressive behavior. ? ?Patient is currently on hospice and according to her granddaughter was recently started on Augmentin for suspected pneumonia.  On the day of admission her granddaughter noted that she had become increasingly agitated and have room air pulse oximetry was about 88% and so the hospice nurse placed pt on 2 L of oxygen. ? ?* Delirium due to multiple etiologies, acute, hyperactive ?Baseline dementia ?--Pt has been agitated, confused, hyperactive.  Family reported pt has not been sleeping much since prior to presentation, which can trigger or exacerbate delirium.  Pt also has baseline vascular dementia. ?--MRI brain neg for acute finding. ?--Per family, IV haldol 4 mg didn't have any affect on the pt. ?--Pt was started on seroquel 200 mg nightly to help with sleep, which did work on getting pt to sleep.  Pt was discharged on seroquel 200 mg nightly PRN. ?  ?Dementia with behavioral disturbance ?--cont donepezil and celexa ? ?Acute hypoxemic respiratory failure (HCC) 2/2 ?Acute on chronic diastolic CHF ([place]) ?Patient noted to have room air pulse oximetry between 88 and 89% that improved following oxygen supplementation at 2 L ?Chest x-ray suggestive of bilateral pleural effusions. ?--Pt recieved IV lasix 40 mg for diuresis with  good urine output and improvement in O2 requirement.  Pt was on room air prior to discharge. ?  ?Breast cancer ([place]) ?Continue letrozole ? ?Hypokalemia, acute on chronic ?--pt was on potassium suppl at home.  Hypokalemia also caused by diuresis.   ?--potassium repleted with oral potassium.   ?--discharged on home potassium supplement. ? ?Comfort care measures ?--palliative care consulted and after discussion with family, decision made to make pt comfort care and pt went home with hospice.   ? ? ?Discharge Diagnoses:  ?Principal Problem: ?  Delirium due to multiple etiologies, acute, hyperactive ?Active Problems: ?  AMS (altered mental status) ?  Breast cancer ([place]) ?  Acute hypoxemic respiratory failure ([place]) ?  Acute on chronic diastolic CHF (congestive heart failure) ([place]) ?  Dementia with behavioral disturbance ? ? ?30 Day Unplanned Readmission Risk Score   ? ?Flowsheet Row ED to Hosp-Admission (Current) from 07/25/2021 in [place]  ?30 Day Unplanned Readmission Risk Score (%) 16.97 Filed at 07/27/2021 0801  ? ?  ? ? This score is the patient's risk of an unplanned readmission within 30 days of being discharged (0 -100%). The score is based on dignosis, age, lab data, medications, orders, and past utilization.   ?Low:  0-14.9   Medium: 15-21.9   High: 22-29.9   Extreme: 30 and above ? ?  ? ?  ? ? ?Discharge Instructions: ? ?Allergies as of 07/27/2021   ? ?   Reactions  ? Ace Inhibitors Swelling  ? Other reaction(s): Angioedema  ? Cortisone Other (See Comments)  ? Increased blood pressure & heart rate ?Increased blood pressure &  heart rate  ? Codeine Other (See Comments)  ? Chest pain ?Chest pain  ? Dextromethorphan-guaifenesin Other (See Comments)  ?  CHEST PAIN. ?Other Reaction: CHEST PAIN. ?Other reaction(s): Other (See Comments) ?Other Reaction: CHEST PAIN.  ? Other   ? Son reports whole system is affected and pt is sensitive to everything;  Memory is still delayed  from anesthesia 3 months ago  ? ?  ? ?  ?Medication List  ?  ? ?STOP taking these medications   ? ?ascorbic acid 1000 MG tablet ?Commonly known as: VITAMIN C ?  ?dorzolamide 2 % ophthalmic solution ?Commonly known as: TRUSOPT ?  ?Glucosamine Chondroitin Complx Caps ?  ?magnesium oxide 400 MG tablet ?Commonly known as: MAG-OX ?  ?SM Vitamin D3 100 MCG (4000 UT) Caps ?Generic drug: Cholecalciferol ?  ? ?  ? ?TAKE these medications   ? ?acetaminophen 500 MG tablet ?Commonly known as: TYLENOL ?Take 500-1,000 mg by mouth every 6 (six) hours as needed for mild pain or moderate pain. ?  ?amLODipine 10 MG tablet ?Commonly known as: NORVASC ?Take 1 tablet (10 mg total) by mouth daily. ?  ?Ativan 0.5 MG tablet ?Generic drug: LORazepam ?Take 0.5 mg by mouth every 4 (four) hours as needed. ?  ?citalopram 10 MG tablet ?Commonly known as: CELEXA ?Take 1 tablet by mouth daily. ?  ?donepezil 5 MG tablet ?Commonly known as: ARICEPT ?Take 1 tablet by mouth at bedtime. ?  ?dorzolamide-timolol 22.3-6.8 MG/ML ophthalmic solution ?Commonly known as: COSOPT ?Place 1 drop into both eyes 2 times daily at 12 noon and 4 pm. ?  ?latanoprost 0.005 % ophthalmic solution ?Commonly known as: XALATAN ?Place 1 drop into both eyes at bedtime. ?  ?letrozole 2.5 MG tablet ?Commonly known as: [place] ?Take 1 tablet (2.5 mg total) by mouth daily. ?  ?Levsin/SL 0.125 MG Subl ?Generic drug: Hyoscyamine Sulfate SL ?Place 1 tablet under the tongue every 4 (four) hours as needed. ?  ?morphine CONCENTRATE 10 mg / 0.5 ml concentrated solution ?Take 0.25-1 mLs by mouth every hour as needed. ?  ?potassium chloride 10 MEQ tablet ?Commonly known as: KLOR-CON ?Take 1 tablet by mouth daily. ?  ?QUEtiapine 200 MG tablet ?Commonly known as: SEROQUEL ?Take 1 tablet (200 mg total) by mouth at bedtime as needed. ?  ?senna 8.6 MG Tabs tablet ?Commonly known as: SENOKOT ?Take 1 tablet (8.6 mg total) by mouth daily. ?  ? ?  ? ? ? Follow-up Information   ? ? Kirk Ruths, MD Follow up in 1 week(s).   ?Specialty: Internal Medicine ?Contact information: ?[place] [address] ?[phone] ? ? ?  ?  ? ?  ?  ? ?  ? ? ?Allergies  ?Allergen Reactions  ? Ace Inhibitors Swelling  ?  Other reaction(s): Angioedema ?  ? Cortisone Other (See Comments)  ?   ?Increased blood pressure & heart rate ?Increased blood pressure & heart rate ?  ? Codeine Other (See Comments)  ?  Chest pain ?Chest pain ?  ? Dextromethorphan-Guaifenesin Other (See Comments)  ?   ? CHEST PAIN. ?Other Reaction: CHEST PAIN. ?Other reaction(s): Other (See Comments) ?Other Reaction: CHEST PAIN. ?  ? Other   ?  Son reports whole system is affected and pt is sensitive to everything;  Memory is still delayed from anesthesia 3 months ago  ? ? ? ?The results of significant diagnostics from this hospitalization (including imaging, microbiology, ancillary and laboratory)  are listed below for reference.  ? ?Consultations: ? ? ?Procedures/Studies: ?DG Chest 2 View ? ?Result Date: 07/25/2021 ?CLINICAL DATA:  A female at age 60 presents for evaluation of shortness of breath and cough. EXAM: CHEST - 2 VIEW COMPARISON:  None FINDINGS: Image rotated slightly to the RIGHT on frontal projection. EKG leads project over the chest. Cardiomediastinal contours and hilar structures are normal. Lungs are clear. No visible pneumothorax. Trace bilateral effusions may be present based on lateral projection. On limited assessment there is no acute skeletal finding IMPRESSION: Possible trace bilateral effusions.  No consolidation. Electronically Signed   By: Zetta Bills M.D.   On: 07/25/2021 15:10  ? ?CT HEAD WO CONTRAST (5MM) ? ?Result Date: 07/25/2021 ?CLINICAL DATA:  Mental status changes EXAM: CT HEAD WITHOUT CONTRAST TECHNIQUE: Contiguous axial images were obtained from the base of the skull through the vertex without intravenous contrast. RADIATION DOSE REDUCTION: This exam was performed according  to the departmental dose-optimization program which includes automated exposure control, adjustment of the mA and/or kV according to patient size and/or use of iterative reconstruction technique. COMPA

## 2021-07-27 NOTE — Progress Notes (Signed)
Spoke to family at bedside. Awaiting tech to relocate hospice bed in home prior to transfer as EMS cannot access in current space. Gave direct contact information to family and will await notification of completion. TOC updated and EMS canceled. ? ?Family had additional concerns regarding comfort care order being placed and medications not discontinued. Will follow up directly with palliative NP. Orma Flaming, RN ? ?

## 2021-07-27 NOTE — TOC Progression Note (Addendum)
Transition of Care (TOC) - Progression Note  ? ? ?Patient Details  ?Name: Suzanne Nunez ?MRN: [id number] ?Date of Birth: 09/29/1931 ? ?Transition of Care (TOC) CM/SW Contact  ?Pete Pelt, RN ?Phone Number: ?07/27/2021, 10:50 AM ? ?Clinical Narrative:   As per family, patient will discharge  with [facility] hospice today .  Patient will require some supplies and equipment from hospice at this time.  Family states they were told by [facility] that [facility] will contact EMS when patient is ready.  Elmo Putt did not reach out to [facility] with details.   ? ?RNCM reached out to [place], who stated that she was unaware of equipment and supply needs and is not sure what she needs at home.  RNCM asked that hospice specify what is needed at patient's home and what RNCM can do to assist with discharge.  Elmo Putt stated she would call family to try to discern what they need. ? ?Joelene Millin from [facility] contacted RNCM.  Joelene Millin will contact [facility] case Freight forwarder and facilitate what is needed for discharge and contact RNCM with details.  Awaiting return call from [place] to set up discharge. ? ?Addendum:  1215 patient is 3rd on ems list care team aware ? ? Addendum 1558 patient is 3rd on ems list, family in agreement with transfer ?  ? ?Expected Discharge Plan and Services ?  ?  ?  ?  ?  ?Expected Discharge Date: 07/27/21               ?  ?  ?  ?  ?  ?  ?  ?  ?  ?  ? ? ?Social Determinants of Health (SDOH) Interventions ?  ? ?Readmission Risk Interventions ?No flowsheet data found. ? ?

## 2021-07-27 NOTE — Progress Notes (Signed)
?                                                   ?Palliative Care Progress Note, Assessment & Plan  ? ?Patient Name: Suzanne Nunez       Date: 07/27/2021 ?DOB: [birth year]  Age: 88 y.o. MRN#: [id number] ?Attending Physician: Enzo Bi, MD ?Primary Care Physician: Kirk Ruths, MD ?Admit Date: 07/25/2021 ? ?Reason for Consultation/Follow-up: Establishing goals of care ? ?Subjective: ?Patient is lying in bed in no apparent distress.  She is sleeping peacefully with respirations even and unlabored.  She awakens to loud verbal stimuli and is quick to return to peaceful rest.  Her granddaughter Ander Purpura and caregiver/friend Kathlee Nations are at bedside. ? ?HPI: ?88 y.o. female  with past medical history of vascular dementia, breast cancer, and HTN admitted on 07/25/2021 with altered mental status with visual hallucinations and aggressive behaviors.  Patient's granddaughter Ander Purpura is POA.  Lauren shares patient was enrolled in hospice approximately 2 weeks ago.  Hospice [facility] suspected pneumonia and started patient on Augmentin.  Mental status continued to decline and warrant brought patient to ER.  ? ?Plan of Care: ?I have reviewed medical records including EPIC notes, labs and imaging, received report from [employer], assessed the patient and then met with patient and family/friend at bedside. ? ?As per granddaughter, patient had a very peaceful, restful, and uneventful night.  Lauren and was stated to the patient fell asleep.  Nursing reports no issues overnight.  Plan is set for patient to return home with hospice.  Reviewed in detail as needed medications for anxiety, breathlessness, agitation, increased work of breathing, insomnia, and nausea/vomiting. ? ?Also review signs of impending death to include mottling of the skin, cool extremities, hyperextension of the neck, drooping of the  nasolabial folds, and hyperextension of the neck.  Reviewed with family and friend at bedside that these are common symptoms that end-of-life is approaching.  However, we can anticipate death at any moment given patient's decreased level of consciousness, poor p.o. intake, and rapid cognitive and functional decline. ? ?Lauren states she is a realist and understands that the patient is approaching end of life and just wants to ensure patient is free from pain. Full comfort measures to remain in place. ? ?Therapeutic silence and active listening provided for family and friend to share their thoughts and emotions regarding patient's current health status.  Emotional support provided. ? ?Questions and concerns were addressed. The family was encouraged to call with questions or concerns.  ? ?Plan is set for patient to discharge with nonemergent transport to home once hospice bed has been moved to appropriate room of home. ? ?TOC to manage these details. ? ?Palliative medicine team will continue to follow the patient throughout her hospitalization. ? ?Care plan was discussed with patient, patient's granddaughter/POA Ander Purpura, patient's caregiver Kathlee Nations ? ?Code Status: ?DNR ? ?Prognosis: ? < 2 weeks ? ?Discharge Planning: ?Home with Hospice ? ?Recommendations/Plan: ?Full comfort measures remain ?DNR remains ?At discharge I would recommend the following medications for home: ?Morphine 0.29m sublingual or liquid Q2H PRN ?Haldol 194msublingual Q6H PRN for agitation ?Continue Seroquel 20076mO QHS with addition of 25-49m21m BID PRN for daytime agitation ?No escalation of care or addition of medications not strictly focused on comfort ? ?Physical Exam ?Vitals reviewed.  ?Constitutional:   ?  General: She is not in acute distress. ?   Appearance: She is not ill-appearing or toxic-appearing.  ?HENT:  ?   Head: Normocephalic and atraumatic.  ?Cardiovascular:  ?   Rate and Rhythm: Normal rate.  ?Pulmonary:  ?   Effort: Pulmonary  effort is normal.  ?Abdominal:  ?   Palpations: Abdomen is soft.  ?Musculoskeletal:  ?   Comments: Generalized weakness  ?Skin: ?   General: Skin is warm and dry.  ?Neurological:  ?   Comments: Nonverbal  ?Psychiatric:     ?   Mood and Affect: Mood is not anxious.     ?   Behavior: Behavior is not agitated.  ?         ?     ?Palliative Assessment/Data: 20% ? ? ? ?  ? ?Total Time 35 minutes  ?Greater than 50%  of this time was spent counseling and coordinating care related to the above assessment and plan. ? ?Thank you for allowing the Palliative Medicine Team to assist in the care of this patient. ? ?Verdell Carmine. Nylia Gavina, DNP, FNP-BC ?Palliative Medicine Team ?Team Phone # [phone] ? ? ?

## 2021-07-27 NOTE — Progress Notes (Signed)
Patient fell asleep around 2130 and has stayed asleep comfortably throughout the night.  ?

## 2021-07-27 NOTE — Plan of Care (Signed)

## 2021-07-27 NOTE — Progress Notes (Signed)
MD order received in [facility] to discharge pt home with hospice today; see TOC notes in [facility] regarding re establishing Hospice services with [facility]; verbally reviewed AVS with pt's daughter, no questions voiced at this time; pt's discharge pending arrival of EMS for nonemergency transport home ?

## 2021-08-12 DEATH — deceased

## 2021-10-10 ENCOUNTER — Ambulatory Visit: Payer: Medicare PPO | Admitting: Oncology

## 2022-09-05 IMAGING — MR MR HEAD W/O CM
13 series · 48 of 48 positions shown · non-contrast
Comparison: 07/25/2021 CT head, no prior MRI.

CLINICAL DATA: Altered mental status, hallucinations and aggressive
behavior

EXAM:
MRI HEAD WITHOUT CONTRAST
TECHNIQUE: Multiplanar, multiecho pulse sequences of the brain and surrounding
structures were obtained without intravenous contrast.

[Series 5: ax dwi_tracew · axial · 3.0mm · 0.65mm/px · z∈[-123,+31]mm · 4 of 48 slices shown (1 of 2)]
[im 1/48]
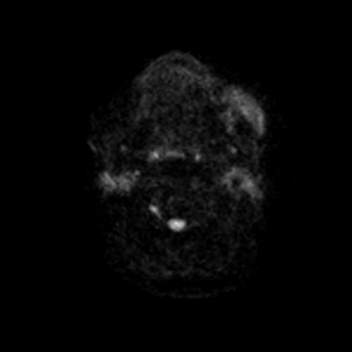
[im 16/48]
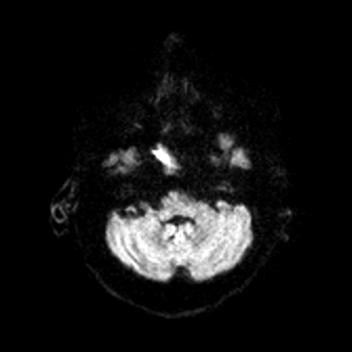
[im 32/48]
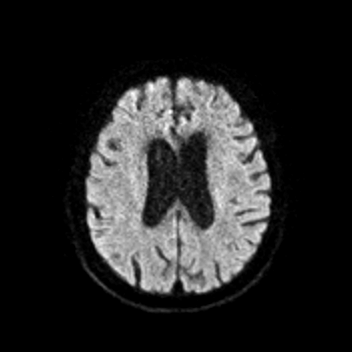
[im 48/48]
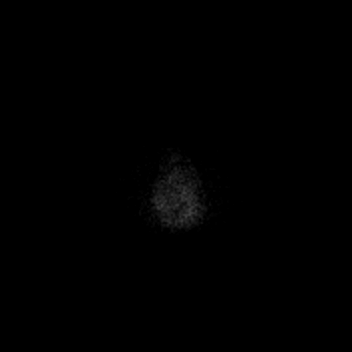

[Series 6: ax dwi_adc · axial · 3.0mm · 0.65mm/px · z∈[-123,+31]mm · 3 of 48 slices shown (1 of 2)]
[im 1/48]
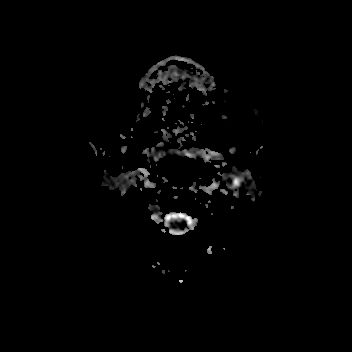
[im 24/48]
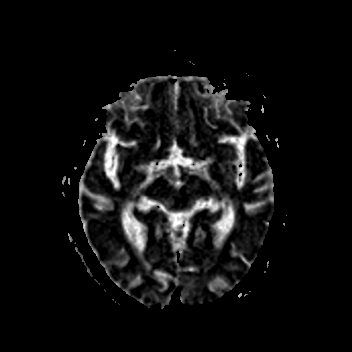
[im 48/48]
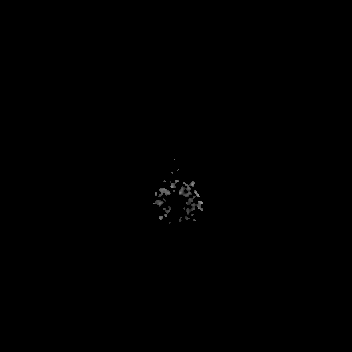

[Series 7: cor dwi_tracew · coronal · 5.0mm · 0.65mm/px · 3 of 36 slices shown]
[im 1/36]
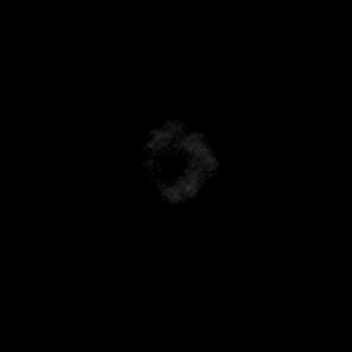
[im 18/36]
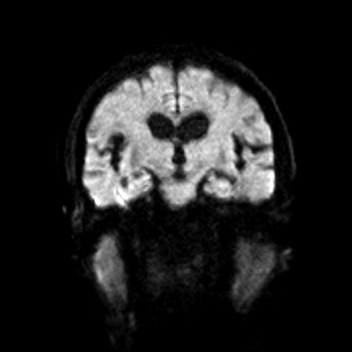
[im 36/36]
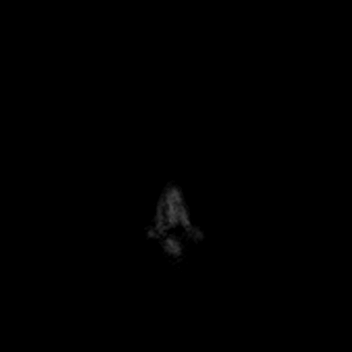

[Series 8: cor dwi_adc · coronal · 5.0mm · 0.65mm/px · 2 of 35 slices shown]
[im 1/35]
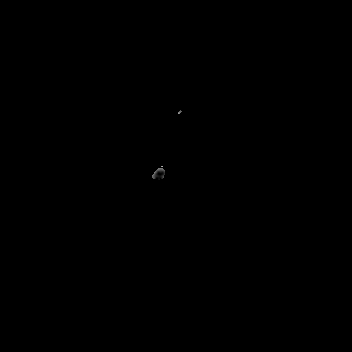
[im 35/35]
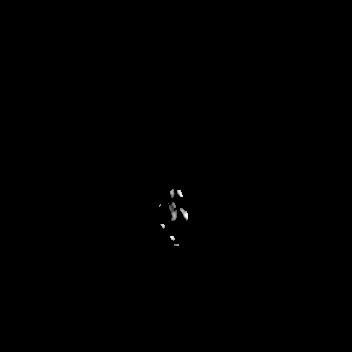

[Series 9: T1 · sagittal · 5.0mm · 0.62mm/px · 2 of 25 slices shown (1 of 2)]
[im 1/25]
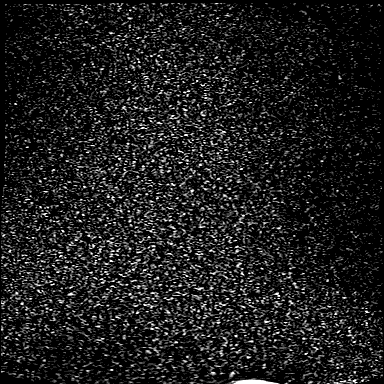
[im 25/25]
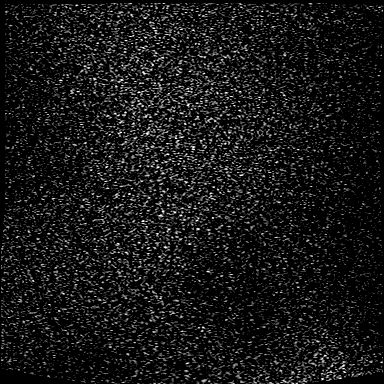

[Series 10: ax dwi_tracew · axial · 3.0mm · 1.31mm/px · z∈[-123,+31]mm · 3 of 48 slices shown (2 of 2)]
[im 1/48]
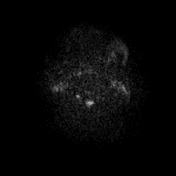
[im 24/48]
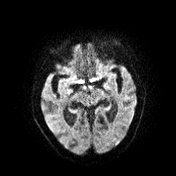
[im 48/48]
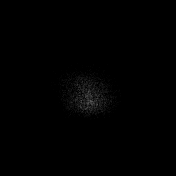

[Series 11: ax dwi_adc · axial · 3.0mm · 1.31mm/px · z∈[-123,+31]mm · 3 of 48 slices shown (2 of 2)]
[im 1/48]
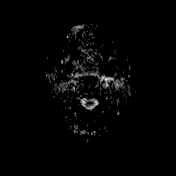
[im 24/48]
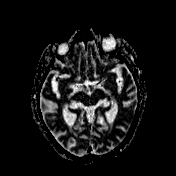
[im 48/48]
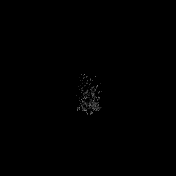

[Series 12: T2 · axial · 5.0mm · 0.86mm/px · z∈[-118,+25]mm · 2 of 25 slices shown (1 of 2)]
[im 1/25]
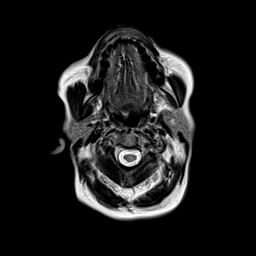
[im 25/25]
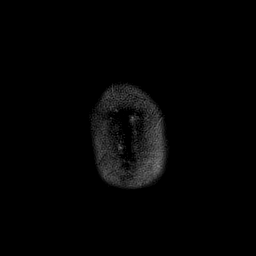

[Series 14: pha_images · axial · 3.0mm · 0.90mm/px · z∈[-123,+29]mm · 4 of 52 slices shown]
[im 1/52]
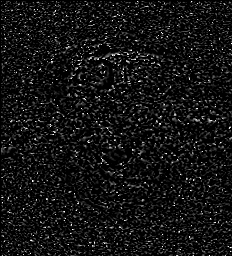
[im 18/52]
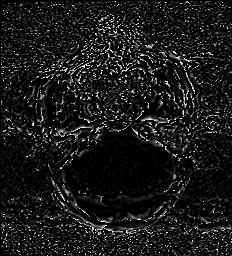
[im 35/52]
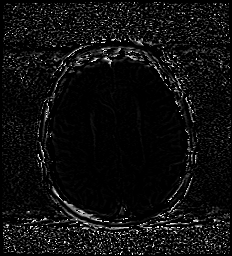
[im 52/52]
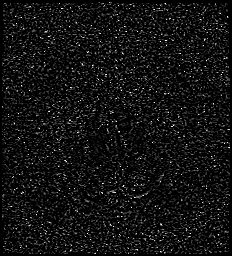

[Series 15: swi_images · axial · 3.0mm · 0.90mm/px · z∈[-123,+29]mm · 4 of 52 slices shown]
[im 1/52]
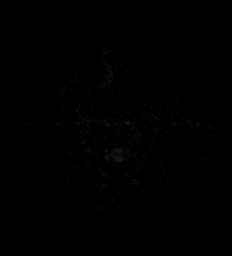
[im 18/52]
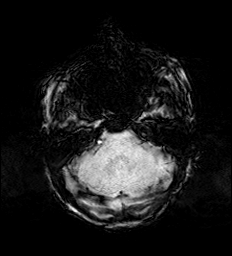
[im 35/52]
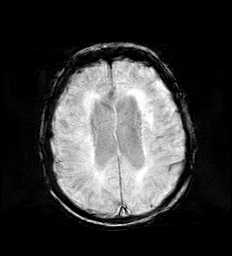
[im 52/52]
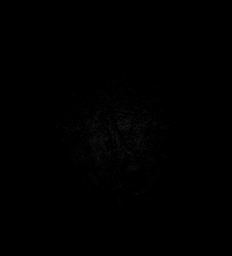

[Series 17: FLAIR · axial · 3.0mm · 0.69mm/px · z∈[-127,+34]mm · 4 of 55 slices shown]
[im 1/55]
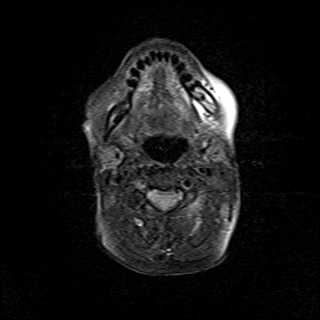
[im 19/55]
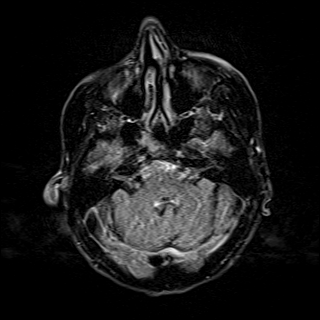
[im 37/55]
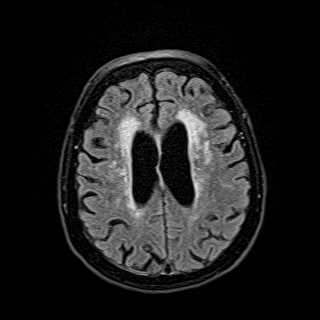
[im 55/55]
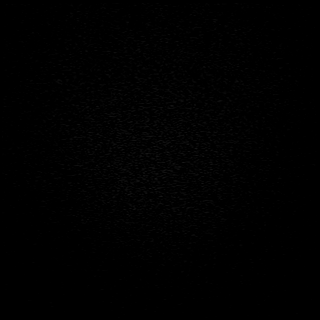

[Series 18: T1 · axial · 1.0mm · 0.98mm/px · z∈[-133,+41]mm · 12 of 176 slices shown (2 of 2)]
[im 1/176]
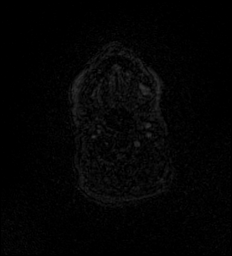
[im 16/176]
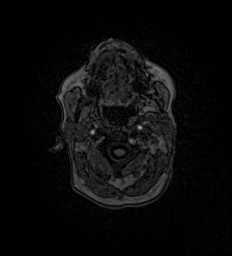
[im 32/176]
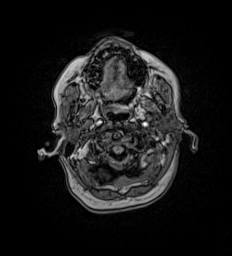
[im 48/176]
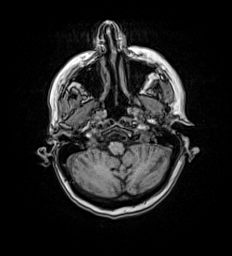
[im 64/176]
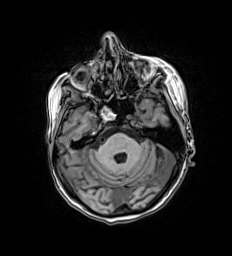
[im 80/176]
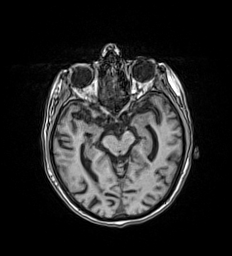
[im 96/176]
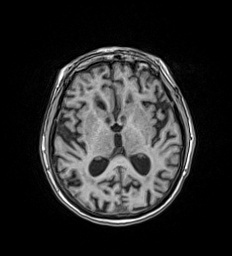
[im 112/176]
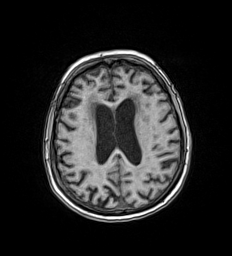
[im 128/176]
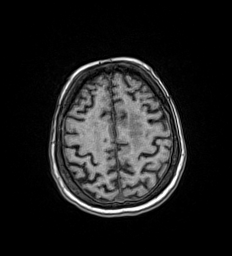
[im 144/176]
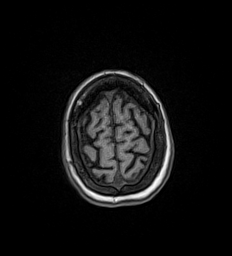
[im 160/176]
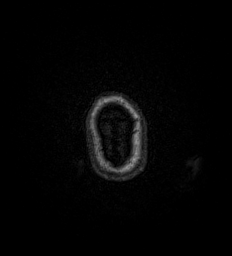
[im 176/176]
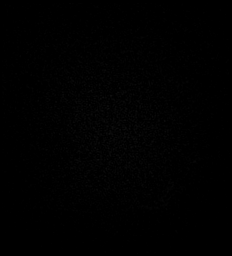

[Series 19: T2 · coronal · 5.0mm · 0.45mm/px · 2 of 30 slices shown (2 of 2)]
[im 1/30]
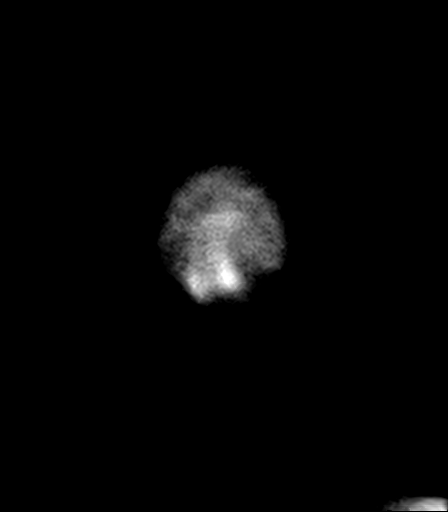
[im 30/30]
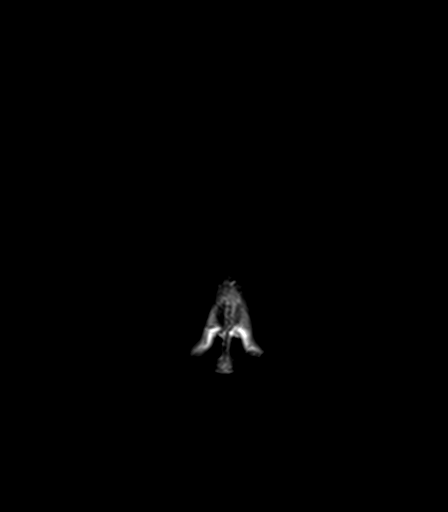

[48 of 48 positions shown; findings below may reference images not displayed]

FINDINGS: Brain: No restricted diffusion to suggest acute or subacute infarct.
No acute hemorrhage, mass, mass effect, or midline shift. No
hemosiderin deposition to suggest remote hemorrhage or superficial
siderosis. Degree of cerebral volume loss is likely within normal
limits for age. No hydrocephalus or extra-axial collection.
Confluent T2 hyperintense signal in the periventricular white matter
and pons, likely the sequela of moderate chronic small vessel
ischemic disease.

Vascular: Normal flow voids.

Skull and upper cervical spine: Normal marrow signal.

Sinuses/Orbits: Inspissated mucus in the right sphenoid sinus.
Otherwise clear. Status post bilateral lens replacements.

Other: The mastoids are well aerated.
IMPRESSION: No acute intracranial process. No etiology seen for the patient's
altered mental status.
